# Patient Record
Sex: Male | Born: 1965 | Hispanic: No | Smoking: Never smoker
Health system: Southern US, Community
[De-identification: ages and names within clinical notes are randomized; demographics above are authoritative.]

## PROBLEM LIST (undated history)

## (undated) DIAGNOSIS — M1990 Unspecified osteoarthritis, unspecified site: Secondary | ICD-10-CM

## (undated) DIAGNOSIS — I10 Essential (primary) hypertension: Secondary | ICD-10-CM

## (undated) HISTORY — PX: HERNIA REPAIR: SHX51

---

## 2020-09-17 ENCOUNTER — Other Ambulatory Visit: Payer: Self-pay

## 2020-09-17 ENCOUNTER — Ambulatory Visit: Payer: Self-pay

## 2020-09-17 ENCOUNTER — Encounter: Payer: Self-pay | Admitting: Orthopaedic Surgery

## 2020-09-17 ENCOUNTER — Ambulatory Visit (INDEPENDENT_AMBULATORY_CARE_PROVIDER_SITE_OTHER): Payer: 59 | Admitting: Orthopaedic Surgery

## 2020-09-17 VITALS — Ht 70.0 in | Wt 182.0 lb

## 2020-09-17 DIAGNOSIS — M1611 Unilateral primary osteoarthritis, right hip: Secondary | ICD-10-CM | POA: Diagnosis not present

## 2020-09-17 DIAGNOSIS — M545 Low back pain, unspecified: Secondary | ICD-10-CM | POA: Diagnosis not present

## 2020-09-17 DIAGNOSIS — G8929 Other chronic pain: Secondary | ICD-10-CM

## 2020-09-17 DIAGNOSIS — M1612 Unilateral primary osteoarthritis, left hip: Secondary | ICD-10-CM | POA: Diagnosis not present

## 2020-09-17 NOTE — Progress Notes (Signed)
Office Visit Note   Patient: Jesus Hammond           Date of Birth: 1965-06-24           MRN: [id number] Visit Date: 09/17/2020              Requested by: No referring provider defined for this encounter. PCP: No primary care provider on file.   Assessment & Plan: Visit Diagnoses:  1. Primary osteoarthritis of left hip   2. Primary osteoarthritis of right hip   3. Chronic midline low back pain without sciatica     Plan: Based on findings I feel that his back is aggravated by the hip disease.  His hips are severely degenerative and worse on the left.  Given how severe his pain and symptoms and radiographic findings I do not feel that cortisone injections or continued conservative treatment would be of any long-term benefit.  He has chronic nighttime pain preventing him from sleeping more than 2 to 3 hours a night.  He is unable to work effectively due to the pain.  Treatment options were discussed and I have recommended sequential total hip replacements versus bilateral total hip replacement and the associated risks and benefits and rehab and recovery time.  We talked extensively about the details of the surgery including risks of infection, DVT, leg length discrepancy, dislocation, thigh numbness.  I have provided him with the necessary information so that he can make an informed decision in the near future.  He will give [place] a call back when he is ready to schedule surgery.  Follow-Up Instructions: Return for Will call back to schedule surgery..   Orders:  Orders Placed This Encounter  Procedures  . XR Lumbar Spine 2-3 Views  . XR Pelvis 1-2 Views   No orders of the defined types were placed in this encounter.     Procedures: No procedures performed   Clinical Data: No additional findings.   Subjective: Chief Complaint  Patient presents with  . 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  He has severe limitation and hip range of motion and severe difficulty with daily activities such as walking and putting on socks and shoes and getting in and out of the bed and his car.  He is fearful of falling.  Recently started having low back pain in the midline without any radicular symptoms.  Denies any injuries.  He has taken ibuprofen for several years with diminishing relief recently.  He has Constant hip and groin pain worse on the left.   Review of Systems  Constitutional: Negative.   All other systems reviewed and are negative.    Objective: Vital Signs: Ht 5\' 10"  (1.778 m)   Wt 182 lb (82.6 kg)   BMI 26.11 kg/m   Physical Exam Vitals and nursing note reviewed.  Constitutional:      Appearance: He is well-developed.  HENT:     Head: Normocephalic and atraumatic.  Eyes:     Pupils: Pupils are equal, round, and reactive to light.  Pulmonary:     Effort: Pulmonary effort is normal.  Abdominal:     Palpations: Abdomen is soft.  Musculoskeletal:        General: Normal range of motion.     Cervical  back: Neck supple.  Skin:    General: Skin is warm.  Neurological:     Mental Status: He is alert and oriented to person, place, and time.  Psychiatric:        Behavior: Behavior normal.        Thought Content: Thought content normal.        Judgment: Judgment normal.     Ortho Exam Left hip shows severe pain with logroll and essentially no internal rotation and 5 degrees of external rotation with severe pain.  Unable to perform straight leg raise secondary to pain in the hip and groin.  Right hip shows severe pain with logroll and no internal rotation and about 10 degrees of external rotation with severe pain.  Unable to perform straight leg raise secondary to pain in the hip and groin.    Lumbar spine shows slight tenderness to  the spinous processes.  No focal findings distally.  Her range of motion is slightly limited secondary to pain. Specialty Comments:  No specialty comments available.  Imaging: XR Lumbar Spine 2-3 Views  Result Date: 09/17/2020 Lumbar spondylosis and facet disease.  No acute abnormalities.  XR Pelvis 1-2 Views  Result Date: 09/17/2020 Advanced degenerative changes to bilateral hips slightly worse on the left side.    PMFS History: There are no problems to display for this patient.  History reviewed. No pertinent past medical history.  History reviewed. No pertinent family history.  History reviewed. No pertinent surgical history. Social History   Occupational History  . Not on file  Tobacco Use  . Smoking status: Not on file  . Smokeless tobacco: Not on file  Substance and Sexual Activity  . Alcohol use: Not on file  . Drug use: Not on file  . Sexual activity: Not on file

## 2020-09-29 ENCOUNTER — Other Ambulatory Visit: Payer: Self-pay

## 2020-11-09 ENCOUNTER — Telehealth: Payer: Self-pay | Admitting: Orthopaedic Surgery

## 2020-11-09 NOTE — Telephone Encounter (Signed)
Received $25.00 cash,medical records release form and FMLA/Disability forms from patient   Forwarding to [facility] today

## 2020-11-11 NOTE — Pre-Procedure Instructions (Signed)
Surgical Instructions    Your procedure is scheduled on Monday July 18th .  Report to [facility] Main Entrance "A" at 05:30 A.M., then check in with the Admitting office.  Call this number if you have problems the morning of surgery:  [phone]   If you have any questions prior to your surgery date call [phone]: Open Monday-Friday 8am-4pm    Remember:  Do not eat after midnight the night before your surgery  You may drink clear liquids until 04:15 A.M. the morning of your surgery.   Clear liquids allowed are: Water, Non-Citrus Juices (without pulp), Carbonated Beverages, Clear Tea, Black Coffee Only, and Gatorade Patient Instructions  The night before surgery:  No food after midnight. ONLY clear liquids after midnight  The day of surgery (if you do NOT have diabetes):  Drink ONE (1) Pre-Surgery Clear Ensure by 04:15 A.M. the morning of surgery. Drink in one sitting. Do not sip.  This drink was given to you during your hospital  pre-op appointment visit.  Nothing else to drink after completing the  Pre-Surgery Clear Ensure.         If you have questions, please contact your surgeon's office.     Take these medicines the morning of surgery with A SIP OF WATER   NONE     As of today, STOP taking any Aspirin (unless otherwise instructed by your surgeon) Aleve, Naproxen, Ibuprofen, Motrin, Advil, Goody's, BC's, all herbal medications, fish oil, all vitamins and pseudoephedrine-acetaminophen.          Do not wear jewelry or makeup Do not wear lotions, powders, perfumes/colognes, or deodorant. Do not shave 48 hours prior to surgery.  Men may shave face and neck. Do not bring valuables to the hospital. DO Not wear nail polish, gel polish, artificial nails, or any other type of covering on natural nails including finger and toenails. If patients have artificial nails, gel coating, etc. that need to be removed by a nail salon please have this removed prior to surgery or  surgery may need to be canceled/delayed if the surgeon/ anesthesia feels like the patient is unable to be adequately monitored.             [facility] is not responsible for any belongings or valuables.  Do NOT Smoke (Tobacco/Vaping) or drink Alcohol 24 hours prior to your procedure If you use a CPAP at night, you may bring all equipment for your overnight stay.   Contacts, glasses, dentures or bridgework may not be worn into surgery, please bring cases for these belongings   For patients admitted to the hospital, discharge time will be determined by your treatment team.   Patients discharged the day of surgery will not be allowed to drive home, and someone needs to stay with them for 24 hours.  ONLY 1 SUPPORT PERSON MAY BE PRESENT WHILE YOU ARE IN SURGERY. IF YOU ARE TO BE ADMITTED ONCE YOU ARE IN YOUR ROOM YOU WILL BE ALLOWED TWO (2) VISITORS.  Minor children may have two parents present. Special consideration for safety and communication needs will be reviewed on a case by case basis.  Special instructions:    Oral Hygiene is also important to reduce your risk of infection.  Remember - BRUSH YOUR TEETH THE MORNING OF SURGERY WITH YOUR REGULAR TOOTHPASTE   [facility]- Preparing For Surgery  Before surgery, you can play an important role. Because skin is not sterile, your skin needs to be as free of germs as  possible. You can reduce the number of germs on your skin by washing with CHG (chlorahexidine gluconate) Soap before surgery.  CHG is an antiseptic cleaner which kills germs and bonds with the skin to continue killing germs even after washing.     Please do not use if you have an allergy to CHG or antibacterial soaps. If your skin becomes reddened/irritated stop using the CHG.  Do not shave (including legs and underarms) for at least 48 hours prior to first CHG shower. It is OK to shave your face.  Please follow these instructions carefully.     Shower the NIGHT BEFORE SURGERY  and the MORNING OF SURGERY with CHG Soap.   If you chose to wash your hair, wash your hair first as usual with your normal shampoo. After you shampoo, rinse your hair and body thoroughly to remove the shampoo.  Then [employer] and genitals (private parts) with your normal soap and rinse thoroughly to remove soap.  After that Use CHG Soap as you would any other liquid soap. You can apply CHG directly to the skin and wash gently with a scrungie or a clean washcloth.   Apply the CHG Soap to your body ONLY FROM THE NECK DOWN.  Do not use on open wounds or open sores. Avoid contact with your eyes, ears, mouth and genitals (private parts). Wash Face and genitals (private parts)  with your normal soap.   Wash thoroughly, paying special attention to the area where your surgery will be performed.  Thoroughly rinse your body with warm water from the neck down.  DO NOT shower/wash with your normal soap after using and rinsing off the CHG Soap.  Pat yourself dry with a CLEAN TOWEL.  Wear CLEAN PAJAMAS to bed the night before surgery  Place CLEAN SHEETS on your bed the night before your surgery  DO NOT SLEEP WITH PETS.   Day of Surgery:  Take a shower with CHG soap. Wear Clean/Comfortable clothing the morning of surgery Do not apply any deodorants/lotions.   Remember to brush your teeth WITH YOUR REGULAR TOOTHPASTE.   Please read over the following fact sheets that you were given.

## 2020-11-12 ENCOUNTER — Encounter (HOSPITAL_COMMUNITY): Payer: Self-pay

## 2020-11-12 ENCOUNTER — Encounter (HOSPITAL_COMMUNITY)
Admission: RE | Admit: 2020-11-12 | Discharge: 2020-11-12 | Disposition: A | Discharge status: Home / Self Care | Payer: 59 | Source: Ambulatory Visit | Attending: Orthopaedic Surgery | Admitting: Orthopaedic Surgery

## 2020-11-12 ENCOUNTER — Telehealth: Payer: Self-pay | Admitting: Orthopaedic Surgery

## 2020-11-12 ENCOUNTER — Other Ambulatory Visit: Payer: Self-pay

## 2020-11-12 ENCOUNTER — Ambulatory Visit (HOSPITAL_COMMUNITY)
Admission: RE | Admit: 2020-11-12 | Discharge: 2020-11-12 | Disposition: A | Discharge status: Home / Self Care | Payer: 59 | Source: Ambulatory Visit | Attending: Physician Assistant | Admitting: Physician Assistant

## 2020-11-12 ENCOUNTER — Other Ambulatory Visit (HOSPITAL_COMMUNITY): Payer: 59

## 2020-11-12 DIAGNOSIS — Z01818 Encounter for other preprocedural examination: Secondary | ICD-10-CM | POA: Diagnosis not present

## 2020-11-12 DIAGNOSIS — M1611 Unilateral primary osteoarthritis, right hip: Secondary | ICD-10-CM

## 2020-11-12 DIAGNOSIS — Z20822 Contact with and (suspected) exposure to covid-19: Secondary | ICD-10-CM | POA: Diagnosis not present

## 2020-11-12 DIAGNOSIS — I4510 Unspecified right bundle-branch block: Secondary | ICD-10-CM | POA: Diagnosis not present

## 2020-11-12 HISTORY — DX: Essential (primary) hypertension: I10

## 2020-11-12 HISTORY — DX: Unspecified osteoarthritis, unspecified site: M19.90

## 2020-11-12 LAB — COMPREHENSIVE METABOLIC PANEL
ALT: 18 U/L (ref 0–44)
AST: 20 U/L (ref 15–41)
Albumin: 4 g/dL (ref 3.5–5.0)
Alkaline Phosphatase: 56 U/L (ref 38–126)
Anion gap: 11 (ref 5–15)
BUN: 8 mg/dL (ref 6–20)
CO2: 23 mmol/L (ref 22–32)
Calcium: 9.4 mg/dL (ref 8.9–10.3)
Chloride: 108 mmol/L (ref 98–111)
Creatinine, Ser: 0.76 mg/dL (ref 0.61–1.24)
GFR, Estimated: >60 mL/min (ref >60)
Glucose, Bld: 103 mg/dL — ABNORMAL HIGH (ref 70–99)
Potassium: 4.7 mmol/L (ref 3.5–5.1)
Sodium: 142 mmol/L (ref 135–145)
Total Bilirubin: 1.1 mg/dL (ref 0.3–1.2)
Total Protein: 7 g/dL (ref 6.5–8.1)

## 2020-11-12 LAB — CBC WITH DIFFERENTIAL/PLATELET
Abs Immature Granulocytes: 0.01 10*3/uL (ref 0.00–0.07)
Basophils Absolute: 0.1 10*3/uL (ref 0.0–0.1)
Basophils Relative: 2 %
Eosinophils Absolute: 0.1 10*3/uL (ref 0.0–0.5)
Eosinophils Relative: 2 %
HCT: 44.9 % (ref 39.0–52.0)
Hemoglobin: 14.1 g/dL (ref 13.0–17.0)
Immature Granulocytes: 0 %
Lymphocytes Relative: 27 %
Lymphs Abs: 1.8 10*3/uL (ref 0.7–4.0)
MCH: 26.7 pg (ref 26.0–34.0)
MCHC: 31.4 g/dL (ref 30.0–36.0)
MCV: 84.9 fL (ref 80.0–100.0)
Monocytes Absolute: 0.5 10*3/uL (ref 0.1–1.0)
Monocytes Relative: 8 %
Neutro Abs: 4.1 10*3/uL (ref 1.7–7.7)
Neutrophils Relative %: 61 %
Platelets: 265 10*3/uL (ref 150–400)
RBC: 5.29 MIL/uL (ref 4.22–5.81)
RDW: 13.4 % (ref 11.5–15.5)
WBC: 6.6 10*3/uL (ref 4.0–10.5)
nRBC: 0 % (ref 0.0–0.2)

## 2020-11-12 LAB — URINALYSIS, ROUTINE W REFLEX MICROSCOPIC
Bilirubin Urine: NEGATIVE
Glucose, UA: NEGATIVE mg/dL
Hgb urine dipstick: NEGATIVE
Ketones, ur: NEGATIVE mg/dL
Leukocytes,Ua: NEGATIVE
Nitrite: NEGATIVE
Protein, ur: NEGATIVE mg/dL
Specific Gravity, Urine: 1.01 (ref 1.005–1.030)
pH: 7 (ref 5.0–8.0)

## 2020-11-12 LAB — PROTIME-INR
INR: 1 (ref 0.8–1.2)
Prothrombin Time: 13.1 seconds (ref 11.4–15.2)

## 2020-11-12 LAB — SURGICAL PCR SCREEN
MRSA, PCR: NEGATIVE
Staphylococcus aureus: NEGATIVE

## 2020-11-12 LAB — SARS CORONAVIRUS 2 (TAT 6-24 HRS): SARS Coronavirus 2: NEGATIVE

## 2020-11-12 LAB — TYPE AND SCREEN
ABO/RH(D): A POS
Antibody Screen: NEGATIVE

## 2020-11-12 LAB — APTT: aPTT: 29 seconds (ref 24–36)

## 2020-11-12 NOTE — Progress Notes (Signed)
Jesus Hammond., PA-C notified on elevated BP during PAT appt. Pt instructed to make appt with PCP asap and possibly get prescribed a BP medication if deemed necessary. Pt given phone number for PCP and stated he would call and make appt asap.

## 2020-11-12 NOTE — Progress Notes (Signed)
Anesthesia Chart Review:  Case: [id number] Date/Time: 11/16/20 0700   Procedure: BILATERAL ANTERIOR TOTAL HIP ARTHROPLASTY (Bilateral: Hip) - 3-C   Anesthesia type: Spinal   Pre-op diagnosis: BILATERAL HIP DEGENERATIVE JOING DISEASE   Location: [facility] ROOM 04 / [facility] OR   Surgeons: Tarry Kos, MD       DISCUSSION: Patient is a 55 year old male scheduled for the above procedure.   History includes never smoker, HTN, arthritis.   Received a call that PAT BP was elevated at 178/113 and 173/113. He reported that he became established with PCP Judd Lien, PA-C in July of last year. He was started on lisinopril 10 mg daily at that time for BP of 143/96. He monitored his BP at work and said numbers improved, so eventually he just stopped taking it. He says that he periodically monitors his BP at work and noted it has been elevated but could not recall the numbers.  He does not otherwise report any significant health issues.  Primary issue for him has been bilateral hip pain. He began having left hip pain about 1 1/12 years ago but over the past year has developed hip pain on the right as well.  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 He does not get chest pain or SOB. No known DM or heart disease. Non-smoker.    Given untreated, poorly controlled HTN and surgery scheduled for next week, he was advised that he should follow-up with primary care asap. Discussed a BP at the current level would likely lead to case delay or cancellation. Unfortunately, there is no BP from his orthopedic visit and no recorded BP since his 11/21/19 visit at [facility], so unclear what his BP trends are. Advised that he either purchase a home BP monitor or perform more routine BP monitoring at work and record results at least until PCP is able to get HTN better under control.   Since his PAT  visit, I received a call from Debbie at Dr. Warren Danes office. Patient reported that he was seen by Salomon Mast, PA-C on 11/12/20 for HTN. (Note is not yet viewable in Care Everywhere.) His BP there was 163/116. He says he was started on lisinopril 40 mg daily and HCTZ 25 mg daily. He plans to take medications on a morning schedule. Discussed HTN and new medications with anesthesiologist Adonis Huguenin, MD since generally both classes of these medications are held on the morning of surgery. Given significantly elevated DBP without medications, it was felt better for Mr. Chesnut to take his lisinopril on the morning of surgery with sips of water. He will hold his HCTZ on the morning of surgery, but anticipate it will be resumed postoperatively per Dr. Warren Danes post-operative orders. (I have added his medications to his medication list.) Eunice Blase has reviewed these instructions with Mr. Dupre.   He will get vitals on arrival and be evaluated by his anesthesia team on the day of surgery.  11/12/20 COVID-19 test negative. 11/12/20 CXR is still in process.     VS: BP (!) 173/113   Pulse 75   Temp 37.1 C (Oral)   Resp 18   Ht 5\' 10"  (1.778 m)   Wt 84.6 kg   SpO2 98%   BMI 26.75 kg/m   PROVIDERS: , PA-C is PBP [facility] Care Everywhere)   LABS: Labs  reviewed: Acceptable for surgery. (all labs ordered are listed, but only abnormal results are displayed)  Labs Reviewed  COMPREHENSIVE METABOLIC PANEL - Abnormal; Notable for the following components:      Result Value   Glucose, Bld 103 (*)    All other components within normal limits  SURGICAL PCR SCREEN  SARS CORONAVIRUS 2 (TAT 6-24 HRS)  CBC WITH DIFFERENTIAL/PLATELET  PROTIME-INR  APTT  URINALYSIS, ROUTINE W REFLEX MICROSCOPIC  TYPE AND SCREEN     IMAGES: CXR 11/12/20: In process.   EKG: 11/12/20: Normal sinus rhythm Incomplete right bundle branch block Poor R wave progression Possible Anterior infarct , age undetermined Abnormal  ECG No old tracing to compare Confirmed by Donato Schultz ([id number]) on 11/12/2020 3:13:41 PM   CV: N/A  Past Medical History:  Diagnosis Date   Arthritis    bilateral hips   Hypertension     Past Surgical History:  Procedure Laterality Date   HERNIA REPAIR Left    inguinal hernia    MEDICATIONS:  hydrochlorothiazide (HYDRODIURIL) 25 MG tablet   lisinopril (ZESTRIL) 40 MG tablet   ibuprofen (ADVIL) 200 MG tablet   pseudoephedrine-acetaminophen (TYLENOL SINUS) 30-500 MG TABS tablet   No current facility-administered medications for this encounter.    Shonna Chock, PA-C Surgical Short Stay/Anesthesiology [facility] Phone [phone] [facility] Phone [phone] 11/13/2020 8:09 AM

## 2020-11-12 NOTE — Telephone Encounter (Signed)
FYI

## 2020-11-12 NOTE — Telephone Encounter (Signed)
FYI:    Patient is scheduled for bilateral hip arthroplasty  Monday July 18th with Dr. Roda Shutters at 7:15am.    Patient went for pre-admission visit this morning at 8am.  BP taken twice with readings of 173/113 and 178/113.  Patient was advised to see PCP as soon as possible.  I have called patient and he is on his way to see a provider NOW- with [facility].  He has seen Irving Copas, PA in the past and was put on BP meds for a short time until controlled.  Patient will call back after being seen to provide [place] with update.  Faxing request for clearance to [facility] provider.

## 2020-11-12 NOTE — Progress Notes (Signed)
PCP - denies Cardiologist - denies  PPM/ICD - denies   Chest x-ray - 11/12/20 EKG - 11/12/20 Stress Test - denies ECHO - denies Cardiac Cath - denies  Sleep Study - denies  No diabetes  Patient instructed to hold all Aspirin, NSAID's, herbal medications, fish oil and vitamins 7 days prior to surgery.   ERAS Protcol -yes PRE-SURGERY Ensure or G2- ensure given  COVID TEST- 11/12/20 in PAT   Anesthesia review: yes. Abnormal EKG and elevated BP at PAT appt  Patient denies shortness of breath, fever, cough and chest pain at PAT appointment   All instructions explained to the patient, with a verbal understanding of the material. Patient agrees to go over the instructions while at home for a better understanding. Patient also instructed to self quarantine after being tested for COVID-19. The opportunity to ask questions was provided.

## 2020-11-13 ENCOUNTER — Other Ambulatory Visit: Payer: Self-pay | Admitting: Physician Assistant

## 2020-11-13 MED ORDER — METHOCARBAMOL 500 MG PO TABS: 500.0000 mg | ORAL_TABLET | Freq: Two times a day (BID) | ORAL | 0 refills | Status: DC | PRN

## 2020-11-13 MED ORDER — ONDANSETRON HCL 4 MG PO TABS: 4.0000 mg | ORAL_TABLET | Freq: Three times a day (TID) | ORAL | 0 refills | Status: DC | PRN

## 2020-11-13 MED ORDER — DOCUSATE SODIUM 100 MG PO CAPS: 100.0000 mg | ORAL_CAPSULE | Freq: Every day | ORAL | 2 refills | Status: AC | PRN

## 2020-11-13 MED ORDER — OXYCODONE-ACETAMINOPHEN 5-325 MG PO TABS: 1.0000 | ORAL_TABLET | Freq: Four times a day (QID) | ORAL | 0 refills | Status: DC | PRN

## 2020-11-13 MED ORDER — ASPIRIN EC 81 MG PO TBEC: 81.0000 mg | DELAYED_RELEASE_TABLET | Freq: Two times a day (BID) | ORAL | 0 refills | Status: DC

## 2020-11-13 NOTE — Anesthesia Preprocedure Evaluation (Addendum)
Anesthesia Evaluation  Patient identified by MRN, date of birth, ID band Patient awake    Reviewed: Allergy & Precautions, NPO status , Patient's Chart, lab work & pertinent test results  Airway Mallampati: II  TM Distance: >3 FB Neck ROM: Full    Dental  (+) Teeth Intact, Dental Advisory Given   Pulmonary neg pulmonary ROS,    breath sounds clear to auscultation       Cardiovascular hypertension, Pt. on medications  Rhythm:Regular Rate:Normal  EKG: 11/12/20: Normal sinus rhythm Incomplete right bundle branch block Poor R wave progression Possible Anterior infarct , age undetermined Abnormal ECG No old tracing to compare   Neuro/Psych negative neurological ROS  negative psych ROS   GI/Hepatic negative GI ROS, Neg liver ROS,   Endo/Other  negative endocrine ROS  Renal/GU negative Renal ROS  negative genitourinary   Musculoskeletal  (+) Arthritis ,   Abdominal   Peds  Hematology negative hematology ROS (+)   Anesthesia Other Findings   Reproductive/Obstetrics                          Anesthesia Physical Anesthesia Plan  ASA: 2  Anesthesia Plan: Spinal   Post-op Pain Management:    Induction:   PONV Risk Score and Plan: 1 and Treatment may vary due to age or medical condition, Propofol infusion, Midazolam, Ondansetron and Dexamethasone  Airway Management Planned: Natural Airway  Additional Equipment:   Intra-op Plan:   Post-operative Plan:   Informed Consent: I have reviewed the patients History and Physical, chart, labs and discussed the procedure including the risks, benefits and alternatives for the proposed anesthesia with the patient or authorized representative who has indicated his/her understanding and acceptance.     Dental advisory given  Plan Discussed with: CRNA  Anesthesia Plan Comments: ( )       Anesthesia Quick Evaluation

## 2020-11-16 ENCOUNTER — Observation Stay (HOSPITAL_COMMUNITY)
Admission: RE | Admit: 2020-11-16 | Discharge: 2020-11-18 | Disposition: A | Discharge status: Home / Self Care | Payer: 59 | Attending: Orthopaedic Surgery | Admitting: Orthopaedic Surgery

## 2020-11-16 ENCOUNTER — Observation Stay (HOSPITAL_COMMUNITY): Payer: 59

## 2020-11-16 ENCOUNTER — Ambulatory Visit (HOSPITAL_COMMUNITY): Payer: 59 | Admitting: Vascular Surgery

## 2020-11-16 ENCOUNTER — Other Ambulatory Visit: Payer: Self-pay

## 2020-11-16 ENCOUNTER — Ambulatory Visit (HOSPITAL_COMMUNITY): Payer: 59

## 2020-11-16 ENCOUNTER — Encounter (HOSPITAL_COMMUNITY)
Admission: RE | Disposition: A | Discharge status: Home / Self Care | Payer: Self-pay | Source: Home / Self Care | Attending: Orthopaedic Surgery

## 2020-11-16 ENCOUNTER — Ambulatory Visit (HOSPITAL_COMMUNITY): Payer: 59 | Admitting: Anesthesiology

## 2020-11-16 ENCOUNTER — Other Ambulatory Visit (HOSPITAL_COMMUNITY): Payer: Self-pay | Admitting: Physician Assistant

## 2020-11-16 ENCOUNTER — Encounter (HOSPITAL_COMMUNITY): Payer: Self-pay | Admitting: Orthopaedic Surgery

## 2020-11-16 DIAGNOSIS — Z419 Encounter for procedure for purposes other than remedying health state, unspecified: Secondary | ICD-10-CM

## 2020-11-16 DIAGNOSIS — Z79899 Other long term (current) drug therapy: Secondary | ICD-10-CM | POA: Diagnosis not present

## 2020-11-16 DIAGNOSIS — M1611 Unilateral primary osteoarthritis, right hip: Secondary | ICD-10-CM

## 2020-11-16 DIAGNOSIS — M160 Bilateral primary osteoarthritis of hip: Principal | ICD-10-CM | POA: Insufficient documentation

## 2020-11-16 DIAGNOSIS — M1612 Unilateral primary osteoarthritis, left hip: Secondary | ICD-10-CM

## 2020-11-16 DIAGNOSIS — Z7982 Long term (current) use of aspirin: Secondary | ICD-10-CM | POA: Diagnosis not present

## 2020-11-16 DIAGNOSIS — I10 Essential (primary) hypertension: Secondary | ICD-10-CM | POA: Insufficient documentation

## 2020-11-16 DIAGNOSIS — Z96649 Presence of unspecified artificial hip joint: Secondary | ICD-10-CM

## 2020-11-16 DIAGNOSIS — Z96643 Presence of artificial hip joint, bilateral: Secondary | ICD-10-CM

## 2020-11-16 HISTORY — PX: TOTAL HIP ARTHROPLASTY: SHX124

## 2020-11-16 HISTORY — PX: BILATERAL ANTERIOR TOTAL HIP ARTHROPLASTY: SHX5567

## 2020-11-16 LAB — ABO/RH: ABO/RH(D): A POS

## 2020-11-16 SURGERY — ARTHROPLASTY, HIP, BILATERAL, TOTAL, ANTERIOR APPROACH
Anesthesia: Spinal | Site: Hip | Laterality: Right

## 2020-11-16 MED ORDER — CHLORHEXIDINE GLUCONATE CLOTH 2 % EX PADS
6.0000 | MEDICATED_PAD | Freq: Every day | CUTANEOUS | Status: DC
  Administered 2020-11-17 – 2020-11-18 (×3): 6 via TOPICAL

## 2020-11-16 MED ORDER — DOCUSATE SODIUM 100 MG PO CAPS
100.0000 mg | ORAL_CAPSULE | Freq: Two times a day (BID) | ORAL | Status: DC
  Administered 2020-11-16 – 2020-11-18 (×4): 100 mg via ORAL
  Filled 2020-11-16 (×4): qty 1

## 2020-11-16 MED ORDER — OXYCODONE HCL ER 10 MG PO T12A
10.0000 mg | EXTENDED_RELEASE_TABLET | Freq: Two times a day (BID) | ORAL | Status: DC
Start: 2020-11-16 — End: 2020-11-18
  Administered 2020-11-16 – 2020-11-18 (×4): 10 mg via ORAL
  Filled 2020-11-16 (×4): qty 1

## 2020-11-16 MED ORDER — OXYCODONE HCL 5 MG PO TABS: 5.0000 mg | ORAL_TABLET | ORAL | Status: DC | PRN

## 2020-11-16 MED ORDER — CEFAZOLIN SODIUM-DEXTROSE 2-4 GM/100ML-% IV SOLN
2.0000 g | INTRAVENOUS | Status: AC
  Administered 2020-11-16: 2 g via INTRAVENOUS
  Filled 2020-11-16: qty 100

## 2020-11-16 MED ORDER — IRRISEPT - 450ML BOTTLE WITH 0.05% CHG IN STERILE WATER, USP 99.95% OPTIME
TOPICAL | Status: DC | PRN
  Administered 2020-11-16 (×2): 450 mL

## 2020-11-16 MED ORDER — SORBITOL 70 % SOLN: 30.0000 mL | Freq: Every day | Status: DC | PRN

## 2020-11-16 MED ORDER — LIDOCAINE 2% (20 MG/ML) 5 ML SYRINGE
INTRAMUSCULAR | Status: AC
  Filled 2020-11-16: qty 5

## 2020-11-16 MED ORDER — MIDAZOLAM HCL 2 MG/2ML IJ SOLN
INTRAMUSCULAR | Status: AC
  Filled 2020-11-16: qty 2

## 2020-11-16 MED ORDER — FENTANYL CITRATE (PF) 250 MCG/5ML IJ SOLN
INTRAMUSCULAR | Status: AC
  Filled 2020-11-16: qty 5

## 2020-11-16 MED ORDER — METOCLOPRAMIDE HCL 5 MG PO TABS: 5.0000 mg | ORAL_TABLET | Freq: Three times a day (TID) | ORAL | Status: DC | PRN

## 2020-11-16 MED ORDER — METHOCARBAMOL 1000 MG/10ML IJ SOLN
500.0000 mg | Freq: Four times a day (QID) | INTRAVENOUS | Status: DC | PRN
  Filled 2020-11-16: qty 5

## 2020-11-16 MED ORDER — BUPIVACAINE-MELOXICAM ER 200-6 MG/7ML IJ SOLN
INTRAMUSCULAR | Status: AC
  Filled 2020-11-16: qty 1

## 2020-11-16 MED ORDER — PHENOL 1.4 % MT LIQD: 1.0000 | OROMUCOSAL | Status: DC | PRN

## 2020-11-16 MED ORDER — TRANEXAMIC ACID-NACL 1000-0.7 MG/100ML-% IV SOLN
INTRAVENOUS | Status: AC
  Filled 2020-11-16: qty 100

## 2020-11-16 MED ORDER — KETOROLAC TROMETHAMINE 15 MG/ML IJ SOLN
30.0000 mg | Freq: Four times a day (QID) | INTRAMUSCULAR | Status: AC | PRN
  Administered 2020-11-17: 30 mg via INTRAVENOUS
  Filled 2020-11-16: qty 2

## 2020-11-16 MED ORDER — LACTATED RINGERS IV SOLN: INTRAVENOUS | Status: DC

## 2020-11-16 MED ORDER — POLYETHYLENE GLYCOL 3350 17 G PO PACK
17.0000 g | PACK | Freq: Every day | ORAL | Status: DC
  Administered 2020-11-17 – 2020-11-18 (×2): 17 g via ORAL
  Filled 2020-11-16 (×2): qty 1

## 2020-11-16 MED ORDER — ALBUMIN HUMAN 5 % IV SOLN
INTRAVENOUS | Status: AC
  Administered 2020-11-16: 12.5 g via INTRAVENOUS
  Filled 2020-11-16: qty 250

## 2020-11-16 MED ORDER — BUPIVACAINE HCL (PF) 0.75 % IJ SOLN: INTRAMUSCULAR | Status: DC | PRN

## 2020-11-16 MED ORDER — DEXAMETHASONE SODIUM PHOSPHATE 10 MG/ML IJ SOLN
INTRAMUSCULAR | Status: DC | PRN
  Administered 2020-11-16: 10 mg via INTRAVENOUS

## 2020-11-16 MED ORDER — VANCOMYCIN HCL 1 G IV SOLR
INTRAVENOUS | Status: DC | PRN
  Administered 2020-11-16 (×2): 1000 mg via TOPICAL

## 2020-11-16 MED ORDER — TRANEXAMIC ACID-NACL 1000-0.7 MG/100ML-% IV SOLN
1000.0000 mg | Freq: Once | INTRAVENOUS | Status: AC
  Administered 2020-11-16: 1000 mg via INTRAVENOUS
  Filled 2020-11-16: qty 100

## 2020-11-16 MED ORDER — ACETAMINOPHEN 500 MG PO TABS
1000.0000 mg | ORAL_TABLET | Freq: Four times a day (QID) | ORAL | Status: AC
  Administered 2020-11-16 – 2020-11-17 (×4): 1000 mg via ORAL
  Filled 2020-11-16 (×4): qty 2

## 2020-11-16 MED ORDER — BUPIVACAINE-MELOXICAM ER 400-12 MG/14ML IJ SOLN
INTRAMUSCULAR | Status: AC
  Filled 2020-11-16: qty 1

## 2020-11-16 MED ORDER — PROPOFOL 10 MG/ML IV BOLUS
INTRAVENOUS | Status: AC
  Filled 2020-11-16: qty 20

## 2020-11-16 MED ORDER — SODIUM CHLORIDE 0.9 % IR SOLN
Status: DC | PRN
  Administered 2020-11-16: 2000 mL

## 2020-11-16 MED ORDER — FENTANYL CITRATE (PF) 100 MCG/2ML IJ SOLN: 25.0000 ug | INTRAMUSCULAR | Status: DC | PRN

## 2020-11-16 MED ORDER — BUPIVACAINE-MELOXICAM ER 400-12 MG/14ML IJ SOLN
INTRAMUSCULAR | Status: DC | PRN
  Administered 2020-11-16 (×2): 7 mL

## 2020-11-16 MED ORDER — DEXAMETHASONE SODIUM PHOSPHATE 10 MG/ML IJ SOLN
INTRAMUSCULAR | Status: AC
  Filled 2020-11-16: qty 1

## 2020-11-16 MED ORDER — RIVAROXABAN 10 MG PO TABS: 10.0000 mg | ORAL_TABLET | Freq: Every day | ORAL | 0 refills | Status: DC

## 2020-11-16 MED ORDER — CHLORHEXIDINE GLUCONATE 0.12 % MT SOLN
15.0000 mL | Freq: Once | OROMUCOSAL | Status: AC
  Administered 2020-11-16: 15 mL via OROMUCOSAL
  Filled 2020-11-16: qty 15

## 2020-11-16 MED ORDER — ONDANSETRON HCL 4 MG/2ML IJ SOLN
INTRAMUSCULAR | Status: AC
  Filled 2020-11-16: qty 2

## 2020-11-16 MED ORDER — MENTHOL 3 MG MT LOZG: 1.0000 | LOZENGE | OROMUCOSAL | Status: DC | PRN

## 2020-11-16 MED ORDER — ORAL CARE MOUTH RINSE: 15.0000 mL | Freq: Once | OROMUCOSAL | Status: AC

## 2020-11-16 MED ORDER — 0.9 % SODIUM CHLORIDE (POUR BTL) OPTIME
TOPICAL | Status: DC | PRN
  Administered 2020-11-16: 2000 mL

## 2020-11-16 MED ORDER — ACETAMINOPHEN 325 MG PO TABS: 325.0000 mg | ORAL_TABLET | Freq: Four times a day (QID) | ORAL | Status: DC | PRN

## 2020-11-16 MED ORDER — ACETAMINOPHEN 500 MG PO TABS: 1000.0000 mg | ORAL_TABLET | Freq: Once | ORAL | Status: DC

## 2020-11-16 MED ORDER — BUPIVACAINE IN DEXTROSE 0.75-8.25 % IT SOLN
INTRATHECAL | Status: DC | PRN
  Administered 2020-11-16: 2 mL via INTRATHECAL

## 2020-11-16 MED ORDER — DIPHENHYDRAMINE HCL 12.5 MG/5ML PO ELIX: 25.0000 mg | ORAL_SOLUTION | ORAL | Status: DC | PRN

## 2020-11-16 MED ORDER — FENTANYL CITRATE (PF) 250 MCG/5ML IJ SOLN
INTRAMUSCULAR | Status: DC | PRN
  Administered 2020-11-16: 25 ug via INTRAVENOUS
  Administered 2020-11-16 (×2): 50 ug via INTRAVENOUS

## 2020-11-16 MED ORDER — POVIDONE-IODINE 10 % EX SWAB
2.0000 "application " | Freq: Once | CUTANEOUS | Status: AC
  Administered 2020-11-16: 2 via TOPICAL

## 2020-11-16 MED ORDER — PHENYLEPHRINE 40 MCG/ML (10ML) SYRINGE FOR IV PUSH (FOR BLOOD PRESSURE SUPPORT)
PREFILLED_SYRINGE | INTRAVENOUS | Status: AC
  Filled 2020-11-16: qty 10

## 2020-11-16 MED ORDER — SODIUM CHLORIDE 0.9 % IV SOLN: INTRAVENOUS | Status: DC

## 2020-11-16 MED ORDER — VANCOMYCIN HCL 1000 MG IV SOLR
INTRAVENOUS | Status: AC
  Filled 2020-11-16: qty 2000

## 2020-11-16 MED ORDER — ONDANSETRON HCL 4 MG/2ML IJ SOLN
INTRAMUSCULAR | Status: DC | PRN
  Administered 2020-11-16: 4 mg via INTRAVENOUS

## 2020-11-16 MED ORDER — SULFAMETHOXAZOLE-TRIMETHOPRIM 800-160 MG PO TABS: 1.0000 | ORAL_TABLET | Freq: Two times a day (BID) | ORAL | 0 refills | Status: DC

## 2020-11-16 MED ORDER — VANCOMYCIN HCL 1000 MG IV SOLR
INTRAVENOUS | Status: AC
  Filled 2020-11-16: qty 1000

## 2020-11-16 MED ORDER — ONDANSETRON HCL 4 MG PO TABS: 4.0000 mg | ORAL_TABLET | Freq: Four times a day (QID) | ORAL | Status: DC | PRN

## 2020-11-16 MED ORDER — PROPOFOL 500 MG/50ML IV EMUL
INTRAVENOUS | Status: DC | PRN
  Administered 2020-11-16 (×2): 75 ug/kg/min via INTRAVENOUS

## 2020-11-16 MED ORDER — TRANEXAMIC ACID-NACL 1000-0.7 MG/100ML-% IV SOLN
1000.0000 mg | INTRAVENOUS | Status: AC
  Administered 2020-11-16 (×2): 1000 mg via INTRAVENOUS
  Filled 2020-11-16: qty 100

## 2020-11-16 MED ORDER — OXYCODONE HCL 5 MG PO TABS: 10.0000 mg | ORAL_TABLET | ORAL | Status: DC | PRN

## 2020-11-16 MED ORDER — PHENYLEPHRINE HCL-NACL 10-0.9 MG/250ML-% IV SOLN
INTRAVENOUS | Status: DC | PRN
  Administered 2020-11-16: 25 ug/min via INTRAVENOUS

## 2020-11-16 MED ORDER — PROPOFOL 10 MG/ML IV BOLUS
INTRAVENOUS | Status: DC | PRN
  Administered 2020-11-16 (×2): 30 mg via INTRAVENOUS
  Administered 2020-11-16: 10 mg via INTRAVENOUS
  Administered 2020-11-16: 30 mg via INTRAVENOUS
  Administered 2020-11-16: 20 mg via INTRAVENOUS

## 2020-11-16 MED ORDER — MIDAZOLAM HCL 2 MG/2ML IJ SOLN
INTRAMUSCULAR | Status: DC | PRN
  Administered 2020-11-16: 2 mg via INTRAVENOUS

## 2020-11-16 MED ORDER — DEXAMETHASONE SODIUM PHOSPHATE 10 MG/ML IJ SOLN
10.0000 mg | Freq: Once | INTRAMUSCULAR | Status: AC
  Administered 2020-11-17: 10 mg via INTRAVENOUS
  Filled 2020-11-16: qty 1

## 2020-11-16 MED ORDER — ALUM & MAG HYDROXIDE-SIMETH 200-200-20 MG/5ML PO SUSP: 30.0000 mL | ORAL | Status: DC | PRN

## 2020-11-16 MED ORDER — PANTOPRAZOLE SODIUM 40 MG PO TBEC
40.0000 mg | DELAYED_RELEASE_TABLET | Freq: Every day | ORAL | Status: DC
  Administered 2020-11-16 – 2020-11-18 (×3): 40 mg via ORAL
  Filled 2020-11-16 (×3): qty 1

## 2020-11-16 MED ORDER — CEFAZOLIN SODIUM-DEXTROSE 2-4 GM/100ML-% IV SOLN
2.0000 g | Freq: Four times a day (QID) | INTRAVENOUS | Status: AC
  Administered 2020-11-16 (×2): 2 g via INTRAVENOUS
  Filled 2020-11-16 (×2): qty 100

## 2020-11-16 MED ORDER — HYDROMORPHONE HCL 1 MG/ML IJ SOLN
0.5000 mg | INTRAMUSCULAR | Status: DC | PRN
  Administered 2020-11-16 – 2020-11-17 (×2): 1 mg via INTRAVENOUS
  Filled 2020-11-16 (×2): qty 1

## 2020-11-16 MED ORDER — MAGNESIUM CITRATE PO SOLN
1.0000 | Freq: Once | ORAL | Status: DC | PRN
  Filled 2020-11-16: qty 296

## 2020-11-16 MED ORDER — METHOCARBAMOL 500 MG PO TABS
500.0000 mg | ORAL_TABLET | Freq: Four times a day (QID) | ORAL | Status: DC | PRN
  Administered 2020-11-16: 500 mg via ORAL
  Filled 2020-11-16: qty 1

## 2020-11-16 MED ORDER — PHENYLEPHRINE 40 MCG/ML (10ML) SYRINGE FOR IV PUSH (FOR BLOOD PRESSURE SUPPORT)
PREFILLED_SYRINGE | INTRAVENOUS | Status: DC | PRN
  Administered 2020-11-16 (×5): 80 ug via INTRAVENOUS

## 2020-11-16 MED ORDER — TRANEXAMIC ACID 1000 MG/10ML IV SOLN
2000.0000 mg | INTRAVENOUS | Status: DC
  Filled 2020-11-16 (×3): qty 20

## 2020-11-16 MED ORDER — ROPIVACAINE HCL 2 MG/ML IJ SOLN
8.0000 mL/h | INTRAMUSCULAR | Status: DC
  Administered 2020-11-16: 8 mL/h via EPIDURAL
  Filled 2020-11-16 (×2): qty 200

## 2020-11-16 MED ORDER — ONDANSETRON HCL 4 MG/2ML IJ SOLN: 4.0000 mg | Freq: Four times a day (QID) | INTRAMUSCULAR | Status: DC | PRN

## 2020-11-16 MED ORDER — METOCLOPRAMIDE HCL 5 MG/ML IJ SOLN: 5.0000 mg | Freq: Three times a day (TID) | INTRAMUSCULAR | Status: DC | PRN

## 2020-11-16 MED ORDER — ALBUMIN HUMAN 5 % IV SOLN: 12.5000 g | Freq: Once | INTRAVENOUS | Status: AC

## 2020-11-16 SURGICAL SUPPLY — 64 items
BAG COUNTER SPONGE SURGICOUNT (BAG) ×6 IMPLANT
BAG DECANTER FOR FLEXI CONT (MISCELLANEOUS) ×9 IMPLANT
CELLS DAT CNTRL 66122 CELL SVR (MISCELLANEOUS) IMPLANT
CLSR STERI-STRIP ANTIMIC 1/2X4 (GAUZE/BANDAGES/DRESSINGS) ×3 IMPLANT
COVER PERINEAL POST (MISCELLANEOUS) ×3 IMPLANT
COVER SURGICAL LIGHT HANDLE (MISCELLANEOUS) ×6 IMPLANT
DRAPE C-ARM 42X72 X-RAY (DRAPES) ×3 IMPLANT
DRAPE POUCH INSTRU U-SHP 10X18 (DRAPES) ×6 IMPLANT
DRAPE STERI IOBAN 125X83 (DRAPES) ×6 IMPLANT
DRAPE U-SHAPE 47X51 STRL (DRAPES) ×12 IMPLANT
DRESSING AQUACEL AG SP 3.5X10 (GAUZE/BANDAGES/DRESSINGS) ×4 IMPLANT
DRSG AQUACEL AG SP 3.5X10 (GAUZE/BANDAGES/DRESSINGS) ×6
DURAPREP 26ML APPLICATOR (WOUND CARE) ×12 IMPLANT
ELECT BLADE 4.0 EZ CLEAN MEGAD (MISCELLANEOUS) ×6
ELECT PENCIL ROCKER SW 15FT (MISCELLANEOUS) ×6 IMPLANT
ELECT REM PT RETURN 9FT ADLT (ELECTROSURGICAL) ×6
ELECTRODE BLDE 4.0 EZ CLN MEGD (MISCELLANEOUS) ×4 IMPLANT
ELECTRODE REM PT RTRN 9FT ADLT (ELECTROSURGICAL) ×4 IMPLANT
GLOVE SURG LTX SZ7 (GLOVE) ×6 IMPLANT
GLOVE SURG NEOP MICRO LF SZ7.5 (GLOVE) ×6 IMPLANT
GLOVE SURG SYN 7.5  E (GLOVE) ×8
GLOVE SURG SYN 7.5 E (GLOVE) ×16 IMPLANT
GLOVE SURG UNDER POLY LF SZ7 (GLOVE) ×21 IMPLANT
GOWN STRL REIN XL XLG (GOWN DISPOSABLE) ×12 IMPLANT
GOWN STRL REUS W/ TWL LRG LVL3 (GOWN DISPOSABLE) ×2 IMPLANT
GOWN STRL REUS W/ TWL XL LVL3 (GOWN DISPOSABLE) ×4 IMPLANT
GOWN STRL REUS W/TWL LRG LVL3 (GOWN DISPOSABLE) ×1
GOWN STRL REUS W/TWL XL LVL3 (GOWN DISPOSABLE) ×2
HANDPIECE INTERPULSE COAX TIP (DISPOSABLE) ×2
HEAD CERAMIC 36 PLUS 8.5 12 14 (Hips) ×3 IMPLANT
HEAD CERAMIC 36 PLUS5 (Hips) ×3 IMPLANT
HOOD PEEL AWAY FLYTE STAYCOOL (MISCELLANEOUS) ×12 IMPLANT
IV NS IRRIG 3000ML ARTHROMATIC (IV SOLUTION) ×6 IMPLANT
JET LAVAGE IRRISEPT WOUND (IRRIGATION / IRRIGATOR) ×6
KIT BASIN OR (CUSTOM PROCEDURE TRAY) ×3 IMPLANT
LAVAGE JET IRRISEPT WOUND (IRRIGATION / IRRIGATOR) ×4 IMPLANT
LINER NEUTRAL 52X36MM PLUS 4 (Liner) ×6 IMPLANT
MARKER SKIN DUAL TIP RULER LAB (MISCELLANEOUS) ×6 IMPLANT
NEEDLE SPNL 18GX3.5 QUINCKE PK (NEEDLE) ×6 IMPLANT
PACK TOTAL JOINT (CUSTOM PROCEDURE TRAY) ×6 IMPLANT
PACK UNIVERSAL I (CUSTOM PROCEDURE TRAY) ×6 IMPLANT
PIN SECTOR W/GRIP ACE CUP 52MM (Hips) ×6 IMPLANT
RTRCTR WOUND ALEXIS 18CM MED (MISCELLANEOUS)
SAW OSC TIP CART 19.5X105X1.3 (SAW) ×6 IMPLANT
SCREW 6.5MMX25MM (Screw) ×6 IMPLANT
SET HNDPC FAN SPRY TIP SCT (DISPOSABLE) ×4 IMPLANT
STAPLER VISISTAT 35W (STAPLE) IMPLANT
STEM FEMORAL SZ5 HIGH ACTIS (Stem) ×3 IMPLANT
STEM FEMORAL SZ6 HIGH ACTIS (Stem) ×3 IMPLANT
SUT ETHIBOND 2 V 37 (SUTURE) ×6 IMPLANT
SUT MNCRL+ AB 3-0 CT1 36 (SUTURE) ×4 IMPLANT
SUT MONOCRYL AB 3-0 CT1 36IN (SUTURE) ×2
SUT VIC AB 0 CT1 27 (SUTURE) ×2
SUT VIC AB 0 CT1 27XBRD ANBCTR (SUTURE) ×4 IMPLANT
SUT VIC AB 1 CTX 36 (SUTURE) ×2
SUT VIC AB 1 CTX36XBRD ANBCTR (SUTURE) ×4 IMPLANT
SUT VIC AB 2-0 CT1 27 (SUTURE) ×4
SUT VIC AB 2-0 CT1 TAPERPNT 27 (SUTURE) ×8 IMPLANT
SYR 50ML LL SCALE MARK (SYRINGE) ×6 IMPLANT
TOWEL GREEN STERILE (TOWEL DISPOSABLE) ×6 IMPLANT
TRAY CATH 16FR W/PLASTIC CATH (SET/KITS/TRAYS/PACK) ×3 IMPLANT
TRAY FOLEY W/BAG SLVR 16FR (SET/KITS/TRAYS/PACK) ×1
TRAY FOLEY W/BAG SLVR 16FR ST (SET/KITS/TRAYS/PACK) ×2 IMPLANT
YANKAUER SUCT BULB TIP NO VENT (SUCTIONS) ×6 IMPLANT

## 2020-11-16 NOTE — Op Note (Signed)
LEFT ANTERIOR TOTAL HIP ARTHROPLASTY, RIGHT TOTAL HIP ARTHROPLASTY  Procedure Note Lewin Pellow   [id number]  Pre-op Diagnosis: BILATERAL HIP DEGENERATIVE JOINT DISEASE     Post-op Diagnosis: same   Operative Procedures: Bilateral total hip replacements. CPT 78469  Surgeon: Gershon Mussel, M.D.  Assist: Oneal Grout, PA-C   Anesthesia: spinal, epidural, local  Left hip: Prosthesis: Depuy Acetabulum: Pinnacle 52 mm Femur: Actis 6 HO Head: 36 mm size: +8.5 Liner: +4 Bearing Type: ceramic/poly  Right hip: Acetabulum: Pinnacle 52 mm Femur: Actis 5 HO Head: 36 mm size: +5 Liner: +4 Bearing Type: ceramic/poly  Total Hip Arthroplasty (Anterior Approach) Op Note:  After informed consent was obtained and the operative extremity marked in the holding area, the patient was brought back to the operating room and placed supine on the HANA table. We began with the left hip.  Next, the operative extremity was prepped and draped in normal sterile fashion. Surgical timeout occurred verifying patient identification, surgical site, surgical procedure and administration of antibiotics.  A modified anterior Smith-Peterson approach to the hip was performed, using the interval between tensor fascia lata and sartorius.  Dissection was carried bluntly down onto the anterior hip capsule. The lateral femoral circumflex vessels were identified and coagulated. A capsulotomy was performed and the capsular flaps tagged for later repair.  The neck osteotomy was performed. The femoral head was removed which showed severe wear and osteophytes, the acetabular rim was cleared of soft tissue and attention was turned to reaming the acetabulum.  Sequential reaming was performed under fluoroscopic guidance. We reamed to a size 51 mm, and then impacted the acetabular shell. A 25 mm cancellous screw was placed through the shell for added fixation.  The liner was then placed after irrigation and attention turned to  the femur.  After placing the femoral hook, the leg was taken to externally rotated, extended and adducted position taking care to perform soft tissue releases to allow for adequate mobilization of the femur. Soft tissue was cleared from the shoulder of the greater trochanter and the hook elevator used to improve exposure of the proximal femur. Sequential broaching performed up to a size 6. Trial neck and head were placed. The leg was brought back up to neutral and the construct reduced.  Antibiotic irrigation was placed in the surgical wound and kept for at least 1 minute.  The position and sizing of components, offset and leg lengths were checked using fluoroscopy. Stability of the construct was checked in extension and external rotation without any subluxation or impingement of prosthesis. We dislocated the prosthesis, dropped the leg back into position, removed trial components, and irrigated copiously. The final stem and head were then placed, the leg brought back up, the system reduced and fluoroscopy used to verify positioning.  We irrigated, obtained hemostasis and closed the capsule using #2 ethibond suture.  One gram of vancomycin powder was placed in the surgical bed.   One gram of topical tranexamic acid was injected into the joint.  The fascia was closed with #1 vicryl plus, the deep fat layer was closed with 0 vicryl, the subcutaneous layers closed with 2.0 Vicryl Plus and the skin closed with 3.0 monocryl. A sterile dressing was applied.   We then re-prepped and draped the right hip for surgery.  The steps of the surgery were repeated as mentioned above.  The size of the components are listed above.  The surgical site was closed in a layered fashion with the same sutures  and dressing.    The patient was awakened in the operating room and taken to recovery in stable condition.  All sponge, needle, and instrument counts were correct at the end of the case.   Tessa Lerner, my PA, was a  medical necessity for opening, closing, limb positioning, retracting, exposing, and overall facilitation and timely completion of the surgery.  Position: supine  Complications: see description of procedure.  Time Out: performed   Drains/Packing: none  Estimated blood loss: see anesthesia record  Returned to Recovery Room: in good condition.   Antibiotics: yes   Mechanical VTE (DVT) Prophylaxis: sequential compression devices, TED thigh-high  Chemical VTE (DVT) Prophylaxis: xarelto to begin after removal of epidural catheter   Fluid Replacement: see anesthesia record  Specimens Removed: 1 to pathology   Sponge and Instrument Count Correct? yes   PACU: portable radiograph - low AP   Plan/RTC: Return in 2 weeks for staple removal. Weight Bearing/Load Lower Extremity: full  Hip precautions: none Suture Removal: 2 weeks   N. Glee Arvin, MD [facility] 9:07 AM   Implant Name Type Inv. Item Serial No. Manufacturer Lot No. LRB No. Used Action  LINER NEUTRAL [id number] PLUS 4 - [id number] Liner LINER NEUTRAL 52X36MM PLUS 4  [employer] [id number] Left 1 Implanted  PIN SECTOR W/GRIP ACE CUP - [id number] Hips PIN SECTOR W/GRIP ACE CUP  [employer] [id number] Left 1 Implanted  SCREW 6.5MMX25MM - [id number] Screw SCREW 6.5MMX25MM  [employer] [id number] Left 1 Implanted  STEM FEMORAL SZ6 HIGH ACTIS - [id number] Stem STEM FEMORAL SZ6 HIGH ACTIS  [employer] [id number] Left 1 Implanted  HEAD CERAMIC 36 PLUS 8.5 12 14  - [id number] Hips HEAD CERAMIC 36 PLUS 8.5 12 14   [employer] [id number] Left 1 Implanted

## 2020-11-16 NOTE — H&P (Signed)
PREOPERATIVE H&P  Chief Complaint: BILATERAL HIP DEGENERATIVE JOING DISEASE  HPI: Jesus Hammond is a 55 y.o. male who presents for surgical treatment of BILATERAL HIP DEGENERATIVE JOING DISEASE.  He denies any changes in medical history.  Past Medical History:  Diagnosis Date   Arthritis    bilateral hips   Hypertension    Past Surgical History:  Procedure Laterality Date   HERNIA REPAIR Left    inguinal hernia   Social History   Socioeconomic History   Marital status: Unknown    Spouse name: Not on file   Number of children: Not on file   Years of education: Not on file   Highest education level: Not on file  Occupational History   Not on file  Tobacco Use   Smoking status: Never   Smokeless tobacco: Never  Vaping Use   Vaping Use: Never used  Substance and Sexual Activity   Alcohol use: Yes    Comment: 2 a week   Drug use: Never   Sexual activity: Not on file  Other Topics Concern   Not on file  Social History Narrative   Not on file   Social Determinants of Health   Financial Resource Strain: Not on file  Food Insecurity: Not on file  Transportation Needs: Not on file  Physical Activity: Not on file  Stress: Not on file  Social Connections: Not on file   No family history on file. Allergies  Allergen Reactions   Penicillins Other (See Comments)    Unknown - episode happened during childhood, thinks possible rash. As an Infant Other reaction(s): Other As an Infant    Prior to Admission medications   Medication Sig Start Date End Date Taking? Authorizing Provider  ibuprofen (ADVIL) 200 MG tablet Take 400 mg by mouth every 6 (six) hours as needed for mild pain or moderate pain.   Yes [provider]  pseudoephedrine-acetaminophen (TYLENOL SINUS) 30-500 MG TABS tablet Take 1 tablet by mouth every 4 (four) hours as needed (sinus problems).   Yes [provider]  aspirin EC 81 MG tablet Take 1 tablet (81 mg total) by mouth 2  (two) times daily. To be taken after surgery 11/13/20   Cristie Hem, PA-C  docusate sodium (COLACE) 100 MG capsule Take 1 capsule (100 mg total) by mouth daily as needed. 11/13/20 11/13/21  Cristie Hem, PA-C  hydrochlorothiazide (HYDRODIURIL) 25 MG tablet Take 25 mg by mouth daily. 11/12/20   Salomon Mast, PA-C  lisinopril (ZESTRIL) 40 MG tablet Take 40 mg by mouth daily. 11/12/20   Salomon Mast, PA-C  methocarbamol (ROBAXIN) 500 MG tablet Take 1 tablet (500 mg total) by mouth 2 (two) times daily as needed. To be taken after surgery 11/13/20   Cristie Hem, PA-C  ondansetron (ZOFRAN) 4 MG tablet Take 1 tablet (4 mg total) by mouth every 8 (eight) hours as needed for nausea or vomiting. 11/13/20   Cristie Hem, PA-C  oxyCODONE-acetaminophen (PERCOCET) 5-325 MG tablet Take 1-2 tablets by mouth every 6 (six) hours as needed. To be taken after surgery 11/13/20   Cristie Hem, PA-C     Positive ROS: All other systems have been reviewed and were otherwise negative with the exception of those mentioned in the HPI and as above.  Physical Exam: General: Alert, no acute distress Cardiovascular: No pedal edema Respiratory: No cyanosis, no use of accessory musculature GI: abdomen soft Skin: No lesions in the area of chief complaint Neurologic:  Sensation intact distally Psychiatric: Patient is competent for consent with normal mood and affect Lymphatic: no lymphedema  MUSCULOSKELETAL: exam stable  Assessment: BILATERAL HIP DEGENERATIVE JOING DISEASE  Plan: Plan for Procedure(s): BILATERAL ANTERIOR TOTAL HIP ARTHROPLASTY  The risks benefits and alternatives were discussed with the patient including but not limited to the risks of nonoperative treatment, versus surgical intervention including infection, bleeding, nerve injury,  blood clots, cardiopulmonary complications, morbidity, mortality, among others, and they were willing to proceed.   Preoperative templating of the joint  replacement has been completed, documented, and submitted to the Operating Room personnel in order to optimize intra-operative equipment management.   Glee Arvin, MD 11/16/2020 6:06 AM

## 2020-11-16 NOTE — Transfer of Care (Signed)
Immediate Anesthesia Transfer of Care Note  Patient: [name]  Procedure(s) Performed: LEFT ANTERIOR TOTAL HIP ARTHROPLASTY (Left: Hip) RIGHT TOTAL HIP ARTHROPLASTY (Right: Hip)  Patient Location: PACU  Anesthesia Type:MAC and Spinal  Level of Consciousness: alert , oriented and patient cooperative  Airway & Oxygen Therapy: Patient Spontanous Breathing and Patient connected to face mask oxygen  Post-op Assessment: Report given to RN and Post -op Vital signs reviewed and stable  Post vital signs: Reviewed  Last Vitals:  Vitals Value Taken Time  BP    Temp    Pulse 73 11/16/20 1135  Resp 20 11/16/20 1135  SpO2 95 % 11/16/20 1135  Vitals shown include unvalidated device data.  Last Pain:  Vitals:   11/16/20 0631  TempSrc:   PainSc: 2       Patients Stated Pain Goal: 3 (41/71/27 8718)  Complications: No notable events documented.

## 2020-11-16 NOTE — Anesthesia Postprocedure Evaluation (Signed)
Anesthesia Post Note  Patient: Jesus Hammond  Procedure(s) Performed: LEFT ANTERIOR TOTAL HIP ARTHROPLASTY (Left: Hip) RIGHT TOTAL HIP ARTHROPLASTY (Right: Hip)     Patient location during evaluation: PACU Anesthesia Type: Combined Spinal/Epidural Level of consciousness: oriented and awake and alert Pain management: pain level controlled Vital Signs Assessment: post-procedure vital signs reviewed and stable Respiratory status: spontaneous breathing, respiratory function stable and patient connected to nasal cannula oxygen Cardiovascular status: blood pressure returned to baseline and stable Postop Assessment: no headache, no backache and no apparent nausea or vomiting Anesthetic complications: no   No notable events documented.  Last Vitals:  Vitals:   11/16/20 1550 11/16/20 1614  BP: 122/72 117/90  Pulse: 90 93  Resp: 14 18  Temp: 36.8 C 36.8 C  SpO2: 99% 98%    Last Pain:  Vitals:   11/16/20 1631  TempSrc:   PainSc: 0-No pain                 [name]

## 2020-11-16 NOTE — Progress Notes (Deleted)
Notified by Gaynelle Adu, RN of the potential for patient to transfer to [facility] with epidural. After discussing with 3C RN Ellan Lambert) and pharmacy clinical coordinator Zena Amos, 3C does not normally provide the monitoring required of an epidural patient (for ex. Continuous pulse-ox). Notified AC Kim and discussed with male PACU RN. Alternative placement to be considered if possible.   Miu Chiong S. Merilynn Finland, PharmD, BCPS Clinical Staff Pharmacist [email or web address]

## 2020-11-16 NOTE — Evaluation (Signed)
Physical Therapy Evaluation Patient Details Name: Jesus Hammond MRN: [id number] DOB: [birth year] Today's Date: 11/16/2020   History of Present Illness  Pt adm 7/18 for bil THR direct anterior approach. PMH - HTN, arthritis  Clinical Impression  Pt admitted with above diagnosis and presents to PT with functional limitations due to deficits listed below (See PT problem list). Pt needs skilled PT to maximize independence and safety. Pt motivated to return home and to eventually return to more active lifestyle.    Follow Up Recommendations Follow surgeon's recommendation for DC plan and follow-up therapies    Equipment Recommendations  Rolling walker with 5" wheels;3in1 (PT)    Recommendations for Other Services       Precautions / Restrictions Precautions Precautions: Fall;Other (comment) Precaution Comments: Pt with epidural pain control Restrictions Weight Bearing Restrictions: Yes RLE Weight Bearing: Weight bearing as tolerated LLE Weight Bearing: Weight bearing as tolerated      Mobility  Bed Mobility Overal bed mobility: Needs Assistance Bed Mobility: Supine to Sit;Sit to Supine     Supine to sit: Min assist;HOB elevated Sit to supine: Min assist   General bed mobility comments: Assist to bring legs off bed and elevate trunk into sitting. assist to bring legs back up into bed returning to supine    Transfers Overall transfer level: Needs assistance Equipment used: Rolling walker (2 wheeled) Transfers: Sit to/from Stand Sit to Stand: Min assist         General transfer comment: Assist to bring hips up. Verbal cues for hand placement  Ambulation/Gait             General Gait Details: Pt stood and wt shifted but no steps taken due to nausea  Stairs            Wheelchair Mobility    Modified Rankin (Stroke Patients Only)       Balance Overall balance assessment: No apparent balance deficits (not formally assessed)                                            Pertinent Vitals/Pain Pain Assessment: 0-10 Pain Score: 6  Pain Location: bilateral hips Pain Descriptors / Indicators: Aching;Throbbing Pain Intervention(s): Limited activity within patient's tolerance;Monitored during session;Repositioned    Home Living Family/patient expects to be discharged to:: Private residence Living Arrangements: Alone Available Help at Discharge: Family;Available 24 hours/day (parents, significant other, children) Type of Home: House Home Access: Stairs to enter Entrance Stairs-Rails: None Entrance Stairs-Number of Steps: 2 (small) Home Layout: One level Home Equipment: None      Prior Function Level of Independence: Independent               Hand Dominance        Extremity/Trunk Assessment   Upper Extremity Assessment Upper Extremity Assessment: Overall WFL for tasks assessed    Lower Extremity Assessment Lower Extremity Assessment: RLE deficits/detail;LLE deficits/detail RLE Deficits / Details: Limited by pain s/p THR. Good quad set LLE Deficits / Details: Limited by pain s/p THR. Good quad set       Communication   Communication: No difficulties  Cognition Arousal/Alertness: Awake/alert Behavior During Therapy: WFL for tasks assessed/performed Overall Cognitive Status: Within Functional Limits for tasks assessed  General Comments      Exercises     Assessment/Plan    PT Assessment Patient needs continued PT services  PT Problem List Decreased strength;Decreased activity tolerance;Decreased mobility;Decreased knowledge of use of DME;Pain       PT Treatment Interventions DME instruction;Gait training;Stair training;Functional mobility training;Therapeutic activities;Therapeutic exercise;Patient/family education    PT Goals (Current goals can be found in the Care Plan section)  Acute Rehab PT Goals Patient Stated Goal: be active  again PT Goal Formulation: With patient Time For Goal Achievement: 11/20/20 Potential to Achieve Goals: Good    Frequency 7X/week   Barriers to discharge        Co-evaluation               AM-PAC PT "6 Clicks" Mobility  Outcome Measure Help needed turning from your back to your side while in a flat bed without using bedrails?: A Little Help needed moving from lying on your back to sitting on the side of a flat bed without using bedrails?: A Little Help needed moving to and from a bed to a chair (including a wheelchair)?: Total Help needed standing up from a chair using your arms (e.g., wheelchair or bedside chair)?: A Little Help needed to walk in hospital room?: Total Help needed climbing 3-5 steps with a railing? : Total 6 Click Score: 12    End of Session Equipment Utilized During Treatment: Gait belt Activity Tolerance: Other (comment) (Limited by nausea) Patient left: in bed;with call bell/phone within reach;with bed alarm set;with family/visitor present Nurse Communication: Mobility status PT Visit Diagnosis: Other abnormalities of gait and mobility (R26.89);Pain Pain - Right/Left:  (bilatera) Pain - part of body: Hip    Time: 1715-1750 PT Time Calculation (min) (ACUTE ONLY): 35 min   Charges:   PT Evaluation $PT Eval Low Complexity: 1 Low PT Treatments $Therapeutic Activity: 8-22 mins        [facility] PT Acute Rehabilitation Services Pager [phone] Office [phone]   Angelina Ok [facility] 11/16/2020, 6:35 PM

## 2020-11-16 NOTE — Anesthesia Procedure Notes (Signed)
Procedure Name: MAC Date/Time: 11/16/2020 7:20 AM Performed by: Jenne Campus, CRNA Pre-anesthesia Checklist: Patient identified, Emergency Drugs available, Suction available and Patient being monitored Oxygen Delivery Method: Simple face mask

## 2020-11-16 NOTE — Discharge Instructions (Addendum)
INSTRUCTIONS AFTER JOINT REPLACEMENT   Remove items at home which could result in a fall. This includes throw rugs or furniture in walking pathways ICE to the affected joint every three hours while awake for 30 minutes at a time, for at least the first 3-5 days, and then as needed for pain and swelling.  Continue to use ice for pain and swelling. You may notice swelling that will progress down to the foot and ankle.  This is normal after surgery.  Elevate your leg when you are not up walking on it.   Continue to use the breathing machine you got in the hospital (incentive spirometer) which will help keep your temperature down.  It is common for your temperature to cycle up and down following surgery, especially at night when you are not up moving around and exerting yourself.  The breathing machine keeps your lungs expanded and your temperature down.   DIET:  As you were doing prior to hospitalization, we recommend a well-balanced diet.  DRESSING / WOUND CARE / SHOWERING  Keep the surgical dressing until follow up.  The dressing is water proof, so you can shower without any extra covering.  IF THE DRESSING FALLS OFF or the wound gets wet inside, change the dressing with sterile gauze.  Please use good hand washing techniques before changing the dressing.  Do not use any lotions or creams on the incision until instructed by your surgeon.    ACTIVITY  Increase activity slowly as tolerated, but follow the weight bearing instructions below.   No driving for 6 weeks or until further direction given by your physician.  You cannot drive while taking narcotics.  No lifting or carrying greater than 10 lbs. until further directed by your surgeon. Avoid periods of inactivity such as sitting longer than an hour when not asleep. This helps prevent blood clots.  You may return to work once you are authorized by your doctor.     WEIGHT BEARING   Weight bearing as tolerated with assist device (walker, cane,  etc) as directed, use it as long as suggested by your surgeon or therapist, typically at least 4-6 weeks.   EXERCISES  Results after joint replacement surgery are often greatly improved when you follow the exercise, range of motion and muscle strengthening exercises prescribed by your doctor. Safety measures are also important to protect the joint from further injury. Any time any of these exercises cause you to have increased pain or swelling, decrease what you are doing until you are comfortable again and then slowly increase them. If you have problems or questions, call your caregiver or physical therapist for advice.   Rehabilitation is important following a joint replacement. After just a few days of immobilization, the muscles of the leg can become weakened and shrink (atrophy).  These exercises are designed to build up the tone and strength of the thigh and leg muscles and to improve motion. Often times heat used for twenty to thirty minutes before working out will loosen up your tissues and help with improving the range of motion but do not use heat for the first two weeks following surgery (sometimes heat can increase post-operative swelling).   These exercises can be done on a training (exercise) mat, on the floor, on a table or on a bed. Use whatever works the best and is most comfortable for you.    Use music or television while you are exercising so that the exercises are a pleasant break in your   day. This will make your life better with the exercises acting as a break in your routine that you can look forward to.   Perform all exercises about fifteen times, three times per day or as directed.  You should exercise both the operative leg and the other leg as well.  Exercises include:   Quad Sets - Tighten up the muscle on the front of the thigh (Quad) and hold for 5-10 seconds.   Straight Leg Raises - With your knee straight (if you were given a brace, keep it on), lift the leg to 60  degrees, hold for 3 seconds, and slowly lower the leg.  Perform this exercise against resistance later as your leg gets stronger.  Leg Slides: Lying on your back, slowly slide your foot toward your buttocks, bending your knee up off the floor (only go as far as is comfortable). Then slowly slide your foot back down until your leg is flat on the floor again.  Angel Wings: Lying on your back spread your legs to the side as far apart as you can without causing discomfort.  Hamstring Strength:  Lying on your back, push your heel against the floor with your leg straight by tightening up the muscles of your buttocks.  Repeat, but this time bend your knee to a comfortable angle, and push your heel against the floor.  You may put a pillow under the heel to make it more comfortable if necessary.   A rehabilitation program following joint replacement surgery can speed recovery and prevent re-injury in the future due to weakened muscles. Contact your doctor or a physical therapist for more information on knee rehabilitation.    CONSTIPATION  Constipation is defined medically as fewer than three stools per week and severe constipation as less than one stool per week.  Even if you have a regular bowel pattern at home, your normal regimen is likely to be disrupted due to multiple reasons following surgery.  Combination of anesthesia, postoperative narcotics, change in appetite and fluid intake all can affect your bowels.   YOU MUST use at least one of the following options; they are listed in order of increasing strength to get the job done.  They are all available over the counter, and you may need to use some, POSSIBLY even all of these options:    Drink plenty of fluids (prune juice may be helpful) and high fiber foods Colace 100 mg by mouth twice a day  Senokot for constipation as directed and as needed Dulcolax (bisacodyl), take with full glass of water  Miralax (polyethylene glycol) once or twice a day as  needed.  If you have tried all these things and are unable to have a bowel movement in the first 3-4 days after surgery call either your surgeon or your primary doctor.    If you experience loose stools or diarrhea, hold the medications until you stool forms back up.  If your symptoms do not get better within 1 week or if they get worse, check with your doctor.  If you experience "the worst abdominal pain ever" or develop nausea or vomiting, please contact the office immediately for further recommendations for treatment.   ITCHING:  If you experience itching with your medications, try taking only a single pain pill, or even half a pain pill at a time.  You can also use Benadryl over the counter for itching or also to help with sleep.   TED HOSE STOCKINGS:  Use stockings on both   legs until for at least 2 weeks or as directed by physician office. They may be removed at night for sleeping.  MEDICATIONS:  See your medication summary on the "After Visit Summary" that nursing will review with you.  You may have some home medications which will be placed on hold until you complete the course of blood thinner medication.  It is important for you to complete the blood thinner medication as prescribed.  PRECAUTIONS:  If you experience chest pain or shortness of breath - call 911 immediately for transfer to the hospital emergency department.   If you develop a fever greater that 101 F, purulent drainage from wound, increased redness or drainage from wound, foul odor from the wound/dressing, or calf pain - CONTACT YOUR SURGEON.                                                   FOLLOW-UP APPOINTMENTS:  If you do not already have a post-op appointment, please call the office for an appointment to be seen by your surgeon.  Guidelines for how soon to be seen are listed in your "After Visit Summary", but are typically between 1-4 weeks after surgery.  OTHER INSTRUCTIONS:   Knee Replacement:  Do not place pillow  under knee, focus on keeping the knee straight while resting. CPM instructions: 0-90 degrees, 2 hours in the morning, 2 hours in the afternoon, and 2 hours in the evening. Place foam block, curve side up under heel at all times except when in CPM or when walking.  DO NOT modify, tear, cut, or change the foam block in any way.  POST-OPERATIVE OPIOID TAPER INSTRUCTIONS: It is important to wean off of your opioid medication as soon as possible. If you do not need pain medication after your surgery it is ok to stop day one. Opioids include: Codeine, Hydrocodone(Norco, Vicodin), Oxycodone(Percocet, oxycontin) and hydromorphone amongst others.  Long term and even short term use of opiods can cause: Increased pain response Dependence Constipation Depression Respiratory depression And more.  Withdrawal symptoms can include Flu like symptoms Nausea, vomiting And more Techniques to manage these symptoms Hydrate well Eat regular healthy meals Stay active Use relaxation techniques(deep breathing, meditating, yoga) Do Not substitute Alcohol to help with tapering If you have been on opioids for less than two weeks and do not have pain than it is ok to stop all together.  Plan to wean off of opioids This plan should start within one week post op of your joint replacement. Maintain the same interval or time between taking each dose and first decrease the dose.  Cut the total daily intake of opioids by one tablet each day Next start to increase the time between doses. The last dose that should be eliminated is the evening dose.   MAKE SURE YOU:  Understand these instructions.  Get help right away if you are not doing well or get worse.    Thank you for letting us be a part of your medical care team.  It is a privilege we respect greatly.  We hope these instructions will help you stay on track for a fast and full recovery!     Information on my medicine - XARELTO (Rivaroxaban)  This  medication education was reviewed with me or my healthcare representative as part of my discharge preparation.      Why was Xarelto prescribed for you? Xarelto was prescribed for you to reduce the risk of blood clots forming after orthopedic surgery. The medical term for these abnormal blood clots is venous thromboembolism (VTE).  What do you need to know about xarelto ? Take your Xarelto ONCE DAILY at the same time every day. You may take it either with or without food.  If you have difficulty swallowing the tablet whole, you may crush it and mix in applesauce just prior to taking your dose.  Take Xarelto exactly as prescribed by your doctor and DO NOT stop taking Xarelto without talking to the doctor who prescribed the medication.  Stopping without other VTE prevention medication to take the place of Xarelto may increase your risk of developing a clot.  After discharge, you should have regular check-up appointments with your healthcare provider that is prescribing your Xarelto.    What do you do if you miss a dose? If you miss a dose, take it as soon as you remember on the same day then continue your regularly scheduled once daily regimen the next day. Do not take two doses of Xarelto on the same day.   Important Safety Information A possible side effect of Xarelto is bleeding. You should call your healthcare provider right away if you experience any of the following: Bleeding from an injury or your nose that does not stop. Unusual colored urine (red or dark brown) or unusual colored stools (red or black). Unusual bruising for unknown reasons. A serious fall or if you hit your head (even if there is no bleeding).  Some medicines may interact with Xarelto and might increase your risk of bleeding while on Xarelto. To help avoid this, consult your healthcare provider or pharmacist prior to using any new prescription or non-prescription medications, including herbals, vitamins,  non-steroidal anti-inflammatory drugs (NSAIDs) and supplements.  This website has more information on Xarelto: www.xarelto.com.   

## 2020-11-16 NOTE — Anesthesia Procedure Notes (Signed)
Spinal  Patient location during procedure: OR Start time: 11/16/2020 7:15 AM End time: 11/16/2020 7:25 AM Reason for block: at surgeon's request Staffing Performed: anesthesiologist  Anesthesiologist: Elmer Picker, MD Preanesthetic Checklist Completed: patient identified, IV checked, risks and benefits discussed, surgical consent, monitors and equipment checked, pre-op evaluation and timeout performed Spinal Block Patient position: sitting Prep: DuraPrep and site prepped and draped Patient monitoring: cardiac monitor, continuous pulse ox and blood pressure Approach: midline Location: L3-4 Injection technique: single-shot Needle Needle type: Pencan and Tuohy  Needle gauge: 25 G Needle length: 9 cm Assessment Sensory level: T6 Events: CSF return Additional Notes Functioning IV was confirmed and monitors were applied. Sterile prep and drape, including hand hygiene and sterile gloves were used. The patient was positioned and the spine was prepped. The skin was anesthetized with lidocaine.  Free flow of clear CSF was obtained prior to injecting local anesthetic into the CSF.  The spinal needle aspirated freely following injection.  The needle was carefully withdrawn.  The patient tolerated the procedure well.   CSE performed without complication. Tuohy advanced into epidural space. LOR at 5cm. Pencan inserted into intrathecal space. CSF present. Medications administered intrathecally as per chart. Epidural catheter advanced into epidural space and left at 10cm at skin. Pt tolerated procedure well. VSS.

## 2020-11-17 ENCOUNTER — Encounter (HOSPITAL_COMMUNITY): Payer: Self-pay | Admitting: Orthopaedic Surgery

## 2020-11-17 DIAGNOSIS — M160 Bilateral primary osteoarthritis of hip: Secondary | ICD-10-CM | POA: Diagnosis not present

## 2020-11-17 LAB — CBC
HCT: 35.1 % — ABNORMAL LOW (ref 39.0–52.0)
Hemoglobin: 11.6 g/dL — ABNORMAL LOW (ref 13.0–17.0)
MCH: 27.2 pg (ref 26.0–34.0)
MCHC: 33 g/dL (ref 30.0–36.0)
MCV: 82.4 fL (ref 80.0–100.0)
Platelets: 229 10*3/uL (ref 150–400)
RBC: 4.26 MIL/uL (ref 4.22–5.81)
RDW: 13.2 % (ref 11.5–15.5)
WBC: 12.1 10*3/uL — ABNORMAL HIGH (ref 4.0–10.5)
nRBC: 0 % (ref 0.0–0.2)

## 2020-11-17 LAB — BASIC METABOLIC PANEL
Anion gap: 6 (ref 5–15)
BUN: 9 mg/dL (ref 6–20)
CO2: 25 mmol/L (ref 22–32)
Calcium: 8.6 mg/dL — ABNORMAL LOW (ref 8.9–10.3)
Chloride: 103 mmol/L (ref 98–111)
Creatinine, Ser: 0.78 mg/dL (ref 0.61–1.24)
GFR, Estimated: >60 mL/min (ref >60)
Glucose, Bld: 119 mg/dL — ABNORMAL HIGH (ref 70–99)
Potassium: 3.7 mmol/L (ref 3.5–5.1)
Sodium: 134 mmol/L — ABNORMAL LOW (ref 135–145)

## 2020-11-17 MED ORDER — RIVAROXABAN 10 MG PO TABS
10.0000 mg | ORAL_TABLET | Freq: Every day | ORAL | Status: DC
  Administered 2020-11-17 – 2020-11-18 (×2): 10 mg via ORAL
  Filled 2020-11-17 (×2): qty 1

## 2020-11-17 MED ORDER — ASPIRIN EC 81 MG PO TBEC: 81.0000 mg | DELAYED_RELEASE_TABLET | Freq: Two times a day (BID) | ORAL | 0 refills | Status: DC

## 2020-11-17 NOTE — Addendum Note (Signed)
Addendum  created 11/17/20 0843 by Achille Rich, MD   [employer] properties accepted

## 2020-11-17 NOTE — Progress Notes (Signed)
Patient ID: Jesus Hammond, male   DOB: 12-26-1965, 55 y.o.   MRN: [id number]  Spinal catheter pulled at 830 am without complications. Please start xarelto at 1 pm.  Order has been placed.

## 2020-11-17 NOTE — TOC Initial Note (Signed)
Transition of Care [facility]) - Initial/Assessment Note    Patient Details  Name: Jesus Hammond MRN: [id number] Date of Birth: Feb 10, 1966  [facility] CM/SW Contact:    Kingsley Plan, RN Phone Number: 11/17/2020, 11:46 AM  Clinical Narrative:                 Spoke to patient at bedside. DR Roda Shutters office has arranged home health with [facility] ( confirmed with Stacie).   Patient has girl friend to assist him at home.   Ordered walker, 3 in1 and tub bench with Velna Hatchet with [facility]   Expected Discharge Plan: Home w Home Health Services     Patient Goals and CMS Choice   CMS Medicare.gov Compare Post Acute Care list provided to:: Patient Choice offered to / list presented to : Patient  Expected Discharge Plan and Services Expected Discharge Plan: Home w Home Health Services   Discharge Planning Services: CM Consult Post Acute Care Choice: Home Health, Durable Medical Equipment Living arrangements for the past 2 months: Single Family Home                 DME Arranged: 3-N-1, Tub bench, Walker rolling DME Agency: [facility] Date DME Agency Contacted: 11/17/20 Time DME Agency Contacted: 1144 Representative spoke with at DME Agency: Velna Hatchet HH Arranged: PT, OT HH Agency: [facility] Date [facility] Agency Contacted: 11/17/20 Time HH Agency Contacted: 1144 Representative spoke with at [facility] Agency: Stacie  Prior Living Arrangements/Services Living arrangements for the past 2 months: Single Family Home Lives with:: Significant Other Patient language and need for interpreter reviewed:: Yes Do you feel safe going back to the place where you live?: Yes      Need for Family Participation in Patient Care: Yes (Comment) Care giver support system in place?: Yes (comment)   Criminal Activity/Legal Involvement Pertinent to Current Situation/Hospitalization: No - Comment as needed  Activities of Daily Living      Permission Sought/Granted   Permission granted  to share information with : No              Emotional Assessment Appearance:: Appears stated age Attitude/Demeanor/Rapport: Engaged Affect (typically observed): Accepting Orientation: : Oriented to Self, Oriented to Place, Oriented to  Time, Oriented to Situation Alcohol / Substance Use: Not Applicable Psych Involvement: No (comment)  Admission diagnosis:  Status post bilateral total hip replacement [K24.097] Patient Active Problem List   Diagnosis Date Noted   Primary osteoarthritis of right hip 11/16/2020   Primary osteoarthritis of left hip 11/16/2020   Status post bilateral total hip replacement 11/16/2020   PCP:  Judd Lien, PA-C Pharmacy:   [id number] - [place], [place] - [address] [place] [address] Phone: [phone] Fax: [phone]     Social Determinants of Health (SDOH) Interventions    Readmission Risk Interventions No flowsheet data found.

## 2020-11-17 NOTE — Progress Notes (Signed)
Occupational Therapy Evaluation Patient Details Name: Jesus Hammond MRN: [id number] DOB: 02-Sep-1965 Today's Date: 11/17/2020    History of Present Illness Pt adm 7/18 for bil THR direct anterior approach. PMH - HTN, arthritis   Clinical Impression   Aland was evaluated for the above bilat THR. PTA pt was indep in all ADL/IADLs, he lives alone in a 1 level home with 2 STE, his daughter and significant other can provide 24/7 assist at d/c. Upon evaluation pt was min A for bed mobility and close min guard for stand-pivot from bed>chair with RW. Pt is performing lower body ADLs at max A overall for threading over bilat feet in sitting, and hips in standing. Pt tolerated about 2 minutes of standing functional activity prior to feeling nauseated with L hip pain. Pt would benefit from continued OT acutely to progress function in all ADLs and mobility. Recommend d/c to home with HHOT.     Follow Up Recommendations  Home health OT;Supervision - Intermittent    Equipment Recommendations  Other (comment);Tub/shower bench;3 in 1 bedside commode (RA)       Precautions / Restrictions Precautions Precautions: Fall Restrictions Weight Bearing Restrictions: Yes RLE Weight Bearing: Weight bearing as tolerated LLE Weight Bearing: Weight bearing as tolerated      Mobility Bed Mobility Overal bed mobility: Needs Assistance Bed Mobility: Supine to Sit     Supine to sit: Min assist;HOB elevated     General bed mobility comments: assist for LLE    Transfers Overall transfer level: Needs assistance Equipment used: Rolling walker (2 wheeled) Transfers: Sit to/from Stand Sit to Stand: Min guard         General transfer comment: close min guard for sit to stand, vc for hand placement    Balance Overall balance assessment: No apparent balance deficits (not formally assessed)               ADL either performed or assessed with clinical judgement   ADL Overall ADL's : Needs  assistance/impaired Eating/Feeding: Independent;Sitting   Grooming: Set up;Sitting   Upper Body Bathing: Set up;Supervision/ safety;Sitting   Lower Body Bathing: Moderate assistance;Sit to/from stand   Upper Body Dressing : Set up;Sitting   Lower Body Dressing: Maximal assistance;Sit to/from stand   Toilet Transfer: Min guard;Stand-pivot;RW;BSC   Toileting- Architect and Hygiene: Moderate assistance;Sit to/from stand       Functional mobility during ADLs: Min guard (limited to stand pivot from bed>chair due to pain and nausea) General ADL Comments: lower body assist levels for threading clothes and pulling over bilat hips in standing. toilet stand-pivot transfer this session due to pain and nausea. Pt likely to poregress well with room distance ambulation with rw     Vision Baseline Vision/History: Wears glasses Wears Glasses: Reading only Patient Visual Report: No change from baseline Vision Assessment?: No apparent visual deficits            Pertinent Vitals/Pain Pain Assessment: Faces Faces Pain Scale: Hurts even more Pain Location: Lhip>Rhip & back Pain Descriptors / Indicators: Aching;Throbbing Pain Intervention(s): Limited activity within patient's tolerance;Monitored during session     Hand Dominance Right   Extremity/Trunk Assessment Upper Extremity Assessment Upper Extremity Assessment: Overall WFL for tasks assessed   Lower Extremity Assessment Lower Extremity Assessment: Defer to PT evaluation   Cervical / Trunk Assessment Cervical / Trunk Assessment: Normal   Communication Communication Communication: No difficulties   Cognition Arousal/Alertness: Awake/alert Behavior During Therapy: WFL for tasks assessed/performed Overall Cognitive Status: Within Functional Limits  for tasks assessed               General Comments  VSS on RA, epidural pain control removed prior to session, IV intact, pt request short break from ice machines.             Home Living Family/patient expects to be discharged to:: Private residence Living Arrangements: Alone Available Help at Discharge: Family;Available 24 hours/day Type of Home: House Home Access: Stairs to enter [employer] of Steps: 2 Entrance Stairs-Rails: None Home Layout: One level     Bathroom Shower/Tub: Chief Strategy Officer: Standard Bathroom Accessibility: Yes How Accessible: Accessible via walker Home Equipment: None          Prior Functioning/Environment Level of Independence: Independent               OT Treatment/Interventions: Self-care/ADL training;Therapeutic exercise;DME and/or AE instruction;Therapeutic activities;Patient/family education;Balance training    OT Goals(Current goals can be found in the care plan section) Acute Rehab OT Goals Patient Stated Goal: be active again OT Goal Formulation: With patient Time For Goal Achievement: 12/01/20 Potential to Achieve Goals: Good ADL Goals Pt Will Perform Grooming: with modified independence;standing Pt Will Perform Lower Body Bathing: with supervision;with adaptive equipment;sit to/from stand Pt Will Perform Lower Body Dressing: with supervision;with adaptive equipment;sit to/from stand Pt Will Transfer to Toilet: with supervision;ambulating;grab bars Pt Will Perform Toileting - Clothing Manipulation and hygiene: with supervision;sit to/from stand  OT Frequency: Min 2X/week    AM-PAC OT "6 Clicks" Daily Activity     Outcome Measure Help from another person eating meals?: None Help from another person taking care of personal grooming?: A Little Help from another person toileting, which includes using toliet, bedpan, or urinal?: A Lot Help from another person bathing (including washing, rinsing, drying)?: A Lot Help from another person to put on and taking off regular upper body clothing?: A Little Help from another person to put on and taking off regular lower body  clothing?: A Lot 6 Click Score: 16   End of Session Equipment Utilized During Treatment: Gait belt;Rolling walker Nurse Communication: Mobility status;Precautions;Weight bearing status  Activity Tolerance: Patient tolerated treatment well Patient left: in chair;with call bell/phone within reach;with chair alarm set  OT Visit Diagnosis: Other abnormalities of gait and mobility (R26.89);Repeated falls (R29.6);Muscle weakness (generalized) (M62.81);Pain                Time: 9735-3299 OT Time Calculation (min): 48 min Charges:  OT General Charges $OT Visit: 1 Visit OT Evaluation $OT Eval Moderate Complexity: 1 Mod OT Treatments $Therapeutic Activity: 23-37 mins   Kynzi Levay A Eryc Bodey 11/17/2020, 9:34 AM

## 2020-11-17 NOTE — Progress Notes (Signed)
Patient doing well this am.  Reports minimal discomfort to bilateral hips and lower back overnight.  Able to sit on edge of bed and take a few steps without any issues.    Exam: Bilateral hip dressing c/d/I Calves soft and non-tender EHL/FHL intact  Plan: WBAT BLE ABLA- mild and stable Dr. Armond Hang to remove spinal catheter this am Will start xarelto, but will need to wait 4 hours post catheter removal before resuming.   D/c dispo likely tomorrow

## 2020-11-17 NOTE — Progress Notes (Signed)
Pt doing well.  Epidural functioning with effective analgesia.  Epidural was removed fully intact.  Pt tolerated the removal well.  Insertion site was clean and healthy appearing.    Achille Rich MD

## 2020-11-17 NOTE — Progress Notes (Addendum)
Physical Therapy Treatment Patient Details Name: Jesus Hammond MRN: [id number] DOB: 08-Oct-1965 Today's Date: 11/17/2020    History of Present Illness Pt adm 7/18 for bil THR direct anterior approach. PMH - HTN, arthritis    PT Comments    Pt supine in bed on arrival.  Pt continues to improve.  Progressed to stair training and standing exercises.  Ice pack applied to B hips post session.  HEP issued for home use.  Plan for PT in am before d/c home.     Follow Up Recommendations  Follow surgeon's recommendation for DC plan and follow-up therapies     Equipment Recommendations  Rolling walker with 5" wheels;3in1 (PT)    Recommendations for Other Services       Precautions / Restrictions Precautions Precautions: Fall Restrictions Weight Bearing Restrictions: Yes RLE Weight Bearing: Weight bearing as tolerated LLE Weight Bearing: Weight bearing as tolerated    Mobility  Bed Mobility Overal bed mobility: Needs Assistance Bed Mobility: Supine to Sit;Sit to Supine     Supine to sit: Supervision Sit to supine: Min assist   General bed mobility comments: Pt able to perform edge of bed this session without assistance but required min assistance to return back to bed.    Transfers Overall transfer level: Needs assistance Equipment used: Rolling walker (2 wheeled) Transfers: Sit to/from Stand Sit to Stand: Supervision         General transfer comment: Cues for hand placement, foot placement and forward weight shifting.  Ambulation/Gait Ambulation/Gait assistance: Supervision Gait Distance (Feet): 250 Feet Assistive device: Rolling walker (2 wheeled) Gait Pattern/deviations: Step-through pattern;Trunk flexed     General Gait Details: Cues to push RW forward in conjuction with step through pattern to perform and more fluid gt.  Pt tolerated this progression well.   Stairs Stairs: Yes Stairs assistance: Min assist Stair Management: Step to  pattern;Backwards;Forwards;With walker;One rail Right Number of Stairs: 3 (x1 forwards and x2 backwards.) General stair comments: Performed forwards with R rail and patient with increased pain.  Pt required alternate approach to reduce pain performing backward with RW for support.   Wheelchair Mobility    Modified Rankin (Stroke Patients Only)       Balance Overall balance assessment: No apparent balance deficits (not formally assessed)                                          Cognition Arousal/Alertness: Awake/alert Behavior During Therapy: WFL for tasks assessed/performed Overall Cognitive Status: Within Functional Limits for tasks assessed                                        Exercises Total Joint Exercises  Hip ABduction/ADduction: AROM;Both;5 reps;Standing Knee Flexion: AROM;Both;5 reps;Standing Marching in Standing: AROM;Both;5 reps;Standing Standing Hip Extension: AROM;Both;5 reps;Standing    General Comments        Pertinent Vitals/Pain Pain Assessment: Faces Pain Score: 5  Faces Pain Scale: Hurts even more Pain Location: Lhip>Rhip Pain Descriptors / Indicators: Aching;Throbbing Pain Intervention(s): Monitored during session;Repositioned    Home Living                      Prior Function            PT Goals (current goals can now be found in  the care plan section) Acute Rehab PT Goals Patient Stated Goal: be active again Potential to Achieve Goals: Good Progress towards PT goals: Progressing toward goals    Frequency    7X/week      PT Plan Current plan remains appropriate    Co-evaluation              AM-PAC PT "6 Clicks" Mobility   Outcome Measure  Help needed turning from your back to your side while in a flat bed without using bedrails?: A Little Help needed moving from lying on your back to sitting on the side of a flat bed without using bedrails?: A Little Help needed moving to and  from a bed to a chair (including a wheelchair)?: A Little Help needed standing up from a chair using your arms (e.g., wheelchair or bedside chair)?: A Little Help needed to walk in hospital room?: A Little Help needed climbing 3-5 steps with a railing? : A Little 6 Click Score: 18    End of Session Equipment Utilized During Treatment: Gait belt Activity Tolerance: Patient tolerated treatment well Patient left: in bed;with call bell/phone within reach;with bed alarm set Nurse Communication: Mobility status PT Visit Diagnosis: Other abnormalities of gait and mobility (R26.89);Pain Pain - Right/Left:  (BIL) Pain - part of body: Hip     Time: 1706-1730 PT Time Calculation (min) (ACUTE ONLY): 24 min  Charges:  $Gait Training: 8-22 mins $Therapeutic Exercise: 8-22 mins                     Bonney Leitz , PTA Acute Rehabilitation Services Pager [phone] Office [phone]    Dawnita Molner Artis Delay 11/17/2020, 6:38 PM

## 2020-11-17 NOTE — Progress Notes (Signed)
Physical Therapy Treatment Patient Details Name: Jesus Hammond MRN: [id number] DOB: 07-16-1965 Today's Date: 11/17/2020    History of Present Illness Pt adm 7/18 for bil THR direct anterior approach. PMH - HTN, arthritis    PT Comments    Pt seated in recliner.  He is motivated to progression and tolerated therapeutic exercises and gt progression.  Will f/u later this pm for 2nd session.       Follow Up Recommendations  Follow surgeon's recommendation for DC plan and follow-up therapies     Equipment Recommendations  Rolling walker with 5" wheels;3in1 (PT)    Recommendations for Other Services       Precautions / Restrictions Precautions Precautions: Fall Restrictions Weight Bearing Restrictions: Yes RLE Weight Bearing: Weight bearing as tolerated LLE Weight Bearing: Weight bearing as tolerated    Mobility  Bed Mobility Overal bed mobility: Needs Assistance Bed Mobility: Sit to Supine       Sit to supine: Min assist   General bed mobility comments: Min assistance for back to bed.  Performed with gt belt as leg lifters but still required some assistance as this was his first time using the gt belt.    Transfers Overall transfer level: Needs assistance Equipment used: Rolling walker (2 wheeled) Transfers: Sit to/from Stand Sit to Stand: Supervision         General transfer comment: Cues for hand placement, foot placement and forward weight shifting.  Ambulation/Gait Ambulation/Gait assistance: Min guard Gait Distance (Feet): 150 Feet Assistive device: Rolling walker (2 wheeled) Gait Pattern/deviations: Step-through pattern;Step-to pattern;Trunk flexed     General Gait Details: Cues for sequencing.  Started with step to pattern and then progressed to step through sequencing.   Stairs             Wheelchair Mobility    Modified Rankin (Stroke Patients Only)       Balance                                             Cognition Arousal/Alertness: Awake/alert Behavior During Therapy: WFL for tasks assessed/performed Overall Cognitive Status: Within Functional Limits for tasks assessed                                        Exercises Total Joint Exercises Ankle Circles/Pumps: AROM;Both;20 reps;Supine Quad Sets: AROM;Both;10 reps;Supine Heel Slides: AAROM;Both;10 reps;Supine Long Arc Quad: AROM;Both;10 reps;Seated    General Comments        Pertinent Vitals/Pain Pain Assessment: 0-10 Pain Score: 5  Pain Location: Lhip>Rhip & back Pain Descriptors / Indicators: Aching;Throbbing Pain Intervention(s): Monitored during session;Repositioned    Home Living                      Prior Function            PT Goals (current goals can now be found in the care plan section) Acute Rehab PT Goals Patient Stated Goal: be active again Potential to Achieve Goals: Good Progress towards PT goals: Progressing toward goals    Frequency    7X/week      PT Plan Current plan remains appropriate    Co-evaluation              AM-PAC PT "6 Clicks" Mobility  Outcome Measure  Help needed turning from your back to your side while in a flat bed without using bedrails?: A Little Help needed moving from lying on your back to sitting on the side of a flat bed without using bedrails?: A Little Help needed moving to and from a bed to a chair (including a wheelchair)?: A Little Help needed standing up from a chair using your arms (e.g., wheelchair or bedside chair)?: A Little Help needed to walk in hospital room?: A Little Help needed climbing 3-5 steps with a railing? : A Little 6 Click Score: 18    End of Session Equipment Utilized During Treatment: Gait belt Activity Tolerance: Patient tolerated treatment well Patient left: in bed;with call bell/phone within reach;with bed alarm set Nurse Communication: Mobility status PT Visit Diagnosis: Other abnormalities of gait and  mobility (R26.89);Pain Pain - Right/Left:  (BIL) Pain - part of body: Hip     Time: 8768-1157 PT Time Calculation (min) (ACUTE ONLY): 30 min  Charges:  $Gait Training: 8-22 mins $Therapeutic Activity: 8-22 mins                     Bonney Leitz , PTA Acute Rehabilitation Services Pager [phone] Office [phone]    Yudit Modesitt Artis Delay 11/17/2020, 4:17 PM

## 2020-11-18 DIAGNOSIS — M160 Bilateral primary osteoarthritis of hip: Secondary | ICD-10-CM | POA: Diagnosis not present

## 2020-11-18 MED ORDER — SODIUM CHLORIDE 0.9 % IV BOLUS
1000.0000 mL | Freq: Once | INTRAVENOUS | Status: AC
  Administered 2020-11-18: 1000 mL via INTRAVENOUS

## 2020-11-18 NOTE — Progress Notes (Signed)
Occupational Therapy Treatment Patient Details Name: Jesus Hammond MRN: [id number] DOB: Mar 14, 1966 Today's Date: 11/18/2020    History of present illness Pt adm 7/18 for bil THR direct anterior approach. PMH - HTN, arthritis   OT comments  Willmar is progressing towards his goals well. Pt completed dressing ADLs at a min A level overall. Pt was educated on use of AE however pt reports that his family will assist him for now until he is able to do it himself. Pt is very motivated with great safety awareness. He completed all functional mobility at mod I/supervision level with RW given increased time for pain management. Pt would continued to benefit from continued OT acutely. D/c plan remains appropriate.    Follow Up Recommendations  Home health OT;Supervision - Intermittent    Equipment Recommendations  Other (comment);Tub/shower bench;3 in 1 bedside commode (RW)       Precautions / Restrictions Precautions Precautions: Fall Restrictions Weight Bearing Restrictions: Yes RLE Weight Bearing: Weight bearing as tolerated LLE Weight Bearing: Weight bearing as tolerated       Mobility Bed Mobility Overal bed mobility: Modified Independent Bed Mobility: Supine to Sit     Supine to sit: Modified independent (Device/Increase time)     General bed mobility comments: HOB elevated slightly    Transfers Overall transfer level: Needs assistance Equipment used: Rolling walker (2 wheeled) Transfers: Sit to/from Stand Sit to Stand: Modified independent (Device/Increase time)              Balance Overall balance assessment: No apparent balance deficits (not formally assessed)           ADL either performed or assessed with clinical judgement   ADL Overall ADL's : Needs assistance/impaired       Upper Body Dressing : Set up;Sitting   Lower Body Dressing: Minimal assistance;Sit to/from stand Lower Body Dressing Details (indicate cue type and reason): min A to thread  over bilat socks due to fabric sticking on grippy socks - pt likely able to complete task with mod I in normal, or no socks. Toilet Transfer: Min guard;Ambulation;RW Toilet Transfer Details (indicate cue type and reason): simulated walking aruond bed to chair         Functional mobility during ADLs: Min guard;Rolling walker General ADL Comments: reviewed compensatory tehcniques for lower body dressing, discussed AE options, however pt able to thread shorts without AE. Pt reported his family will assist with socks as needed     Vision   Vision Assessment?: No apparent visual deficits          Cognition Arousal/Alertness: Awake/alert Behavior During Therapy: WFL for tasks assessed/performed Overall Cognitive Status: Within Functional Limits for tasks assessed                        General Comments no new concerns, VSS this session - discussed signs and symptoms of low BP and strategies to ensure safety if pt experiences symptoms upon d/c home    Pertinent Vitals/ Pain       Pain Assessment: Faces Faces Pain Scale: Hurts a little bit Pain Location: Lhip>Rhip Pain Descriptors / Indicators: Aching;Throbbing Pain Intervention(s): Monitored during session   Frequency  Min 2X/week        Progress Toward Goals  OT Goals(current goals can now be found in the care plan section)  Progress towards OT goals: Progressing toward goals  Acute Rehab OT Goals Patient Stated Goal: be active again OT Goal Formulation: With patient Time  For Goal Achievement: 12/01/20 Potential to Achieve Goals: Good ADL Goals Pt Will Perform Grooming: with modified independence;standing Pt Will Perform Lower Body Bathing: with supervision;with adaptive equipment;sit to/from stand Pt Will Perform Lower Body Dressing: with supervision;with adaptive equipment;sit to/from stand Pt Will Transfer to Toilet: with supervision;ambulating;grab bars Pt Will Perform Toileting - Clothing Manipulation and  hygiene: with supervision;sit to/from stand  Plan Discharge plan remains appropriate       AM-PAC OT "6 Clicks" Daily Activity     Outcome Measure   Help from another person eating meals?: None Help from another person taking care of personal grooming?: A Little Help from another person toileting, which includes using toliet, bedpan, or urinal?: A Little Help from another person bathing (including washing, rinsing, drying)?: A Little Help from another person to put on and taking off regular upper body clothing?: None Help from another person to put on and taking off regular lower body clothing?: A Little 6 Click Score: 20    End of Session Equipment Utilized During Treatment: Gait belt;Rolling walker  OT Visit Diagnosis: Other abnormalities of gait and mobility (R26.89);Repeated falls (R29.6);Muscle weakness (generalized) (M62.81);Pain   Activity Tolerance Patient tolerated treatment well   Patient Left in chair;with call bell/phone within reach;with chair alarm set   Nurse Communication Mobility status;Precautions;Weight bearing status        Time: 1348-1406 OT Time Calculation (min): 18 min  Charges: OT General Charges $OT Visit: 1 Visit OT Treatments $Self Care/Home Management : 8-22 mins     Philomene Haff A Agapita Savarino 11/18/2020, 2:14 PM

## 2020-11-18 NOTE — Progress Notes (Signed)
Physical Therapy Treatment Patient Details Name: Jesus Hammond MRN: [id number] DOB: 1966/01/22 Today's Date: 11/18/2020    History of Present Illness Pt adm 7/18 for bil THR direct anterior approach. PMH - HTN, arthritis    PT Comments    Pt seated in recliner with feet in dependent position.  Unable to obtain true set of orthostatics.  Pt does continues to drop in Systolic BP with position changes but with rest periods he does start to recover. Pt able to progress to gt training this pm with no c/o dizziness.  Informed RN and MD of BP readings listed below.   BP Readings Sitting- 101/60 Standing - 78/51 Standing after 3 min - 87/86 Sitting post session - 117/75   Follow Up Recommendations  Follow surgeon's recommendation for DC plan and follow-up therapies     Equipment Recommendations  Rolling walker with 5" wheels;3in1 (PT)    Recommendations for Other Services       Precautions / Restrictions Precautions Precautions: Fall Restrictions Weight Bearing Restrictions: Yes RLE Weight Bearing: Weight bearing as tolerated LLE Weight Bearing: Weight bearing as tolerated    Mobility  Bed Mobility Overal bed mobility: Needs Assistance Bed Mobility: Supine to Sit     Supine to sit: Min guard Sit to supine: Mod assist   General bed mobility comments: Pt seated in recliner on arrival.    Transfers Overall transfer level: Needs assistance Equipment used: Rolling walker (2 wheeled) Transfers: Sit to/from Stand Sit to Stand: Supervision         General transfer comment: Cues for hand placement to and from seated surface.  Pt slow and guarded to return to seated position.  Ambulation/Gait Ambulation/Gait assistance: Supervision Gait Distance (Feet): 200 Feet Assistive device: Rolling walker (2 wheeled) Gait Pattern/deviations: Step-through pattern;Trunk flexed;Step-to pattern     General Gait Details: Pt regressed to step to and appears more cautious this pm.   He denies feeling dizzy.  He did progress back to step through as gt continued.   Stairs             Wheelchair Mobility    Modified Rankin (Stroke Patients Only)       Balance Overall balance assessment: No apparent balance deficits (not formally assessed)                                          Cognition Arousal/Alertness: Awake/alert Behavior During Therapy: WFL for tasks assessed/performed Overall Cognitive Status: Within Functional Limits for tasks assessed                                           General Comments General comments (skin integrity, edema, etc.): no new concerns, VSS this session - discussed signs and symptoms of low BP and strategies to ensure safety if pt experiences symptoms upon d/c home      Pertinent Vitals/Pain Pain Assessment: 0-10 Pain Score: 8  Faces Pain Scale: Hurts a little bit Pain Location: Lhip>Rhip Pain Descriptors / Indicators: Aching;Throbbing;Sore Pain Intervention(s): Monitored during session;Repositioned    Home Living                      Prior Function            PT Goals (current goals can now  be found in the care plan section) Acute Rehab PT Goals Patient Stated Goal: be active again Potential to Achieve Goals: Good Progress towards PT goals: Progressing toward goals    Frequency    7X/week      PT Plan Current plan remains appropriate    Co-evaluation              AM-PAC PT "6 Clicks" Mobility   Outcome Measure  Help needed turning from your back to your side while in a flat bed without using bedrails?: A Little Help needed moving from lying on your back to sitting on the side of a flat bed without using bedrails?: A Little Help needed moving to and from a bed to a chair (including a wheelchair)?: A Little Help needed standing up from a chair using your arms (e.g., wheelchair or bedside chair)?: A Little Help needed to walk in hospital room?: A  Little Help needed climbing 3-5 steps with a railing? : A Little 6 Click Score: 18    End of Session Equipment Utilized During Treatment: Gait belt Activity Tolerance: Patient tolerated treatment well Patient left: in bed;with call bell/phone within reach;with bed alarm set Nurse Communication: Mobility status PT Visit Diagnosis: Other abnormalities of gait and mobility (R26.89);Pain Pain - Right/Left:  (BIL) Pain - part of body: Hip     Time: 5374-8270 PT Time Calculation (min) (ACUTE ONLY): 33 min  Charges:  $Gait Training: 8-22 mins $Therapeutic Activity: 8-22 mins                     Bonney Leitz , PTA Acute Rehabilitation Services Pager [phone] Office [phone]    Milan Perkins Artis Delay 11/18/2020, 3:40 PM

## 2020-11-18 NOTE — Discharge Summary (Signed)
Patient ID: Jesus Hammond MRN: [id number] DOB/AGE: 1965/10/19 55 y.o.  Admit date: 11/16/2020 Discharge date: 11/18/2020  Admission Diagnoses:  Principal Problem:   Primary osteoarthritis of left hip Active Problems:   Primary osteoarthritis of right hip   Status post bilateral total hip replacement   Discharge Diagnoses:  Same  Past Medical History:  Diagnosis Date   Arthritis    bilateral hips   Hypertension     Surgeries: Procedure(s): LEFT ANTERIOR TOTAL HIP ARTHROPLASTY RIGHT TOTAL HIP ARTHROPLASTY on 11/16/2020   Consultants:   Discharged Condition: Improved  Hospital Course: Jesus Hammond is an 55 y.o. male who was admitted 11/16/2020 for operative treatment ofPrimary osteoarthritis of left hip. Patient has severe unremitting pain that affects sleep, daily activities, and work/hobbies. After pre-op clearance the patient was taken to the operating room on 11/16/2020 and underwent  Procedure(s): LEFT ANTERIOR TOTAL HIP ARTHROPLASTY RIGHT TOTAL HIP ARTHROPLASTY.    Patient was given perioperative antibiotics:  Anti-infectives (From admission, onward)    Start     Dose/Rate Route Frequency Ordered Stop   11/16/20 1715  ceFAZolin (ANCEF) IVPB 2g/100 mL premix        2 g 200 mL/hr over 30 Minutes Intravenous Every 6 hours 11/16/20 1615 11/16/20 2228   11/16/20 0827  vancomycin (VANCOCIN) powder  Status:  Discontinued          As needed 11/16/20 0828 11/16/20 1129   11/16/20 0615  ceFAZolin (ANCEF) IVPB 2g/100 mL premix        2 g 200 mL/hr over 30 Minutes Intravenous On call to O.R. 11/16/20 0601 11/16/20 0800   11/16/20 0000  sulfamethoxazole-trimethoprim (BACTRIM DS) 800-160 MG tablet        1 tablet Oral 2 times daily 11/16/20 0730          Patient was given sequential compression devices, early ambulation, and chemoprophylaxis to prevent DVT.  Patient benefited maximally from hospital stay and there were no complications.    Recent vital signs: Patient  Vitals for the past 24 hrs:  BP Temp Temp src Pulse Resp SpO2  11/18/20 0748 109/64 98.8 F (37.1 C) Oral 94 17 95 %  11/18/20 0439 (!) 101/58 99 F (37.2 C) Oral 93 18 95 %  11/18/20 0015 117/69 99.3 F (37.4 C) Oral 95 18 96 %  11/17/20 1947 114/76 98.9 F (37.2 C) Oral 91 18 95 %  11/17/20 1523 109/68 98.2 F (36.8 C) Oral 78 19 96 %  11/17/20 1126 (!) 98/59 98.4 F (36.9 C) Oral 79 19 97 %     Recent laboratory studies:  Recent Labs    11/17/20 0045  WBC 12.1*  HGB 11.6*  HCT 35.1*  PLT 229  NA 134*  K 3.7  CL 103  CO2 25  BUN 9  CREATININE 0.78  GLUCOSE 119*  CALCIUM 8.6*     Discharge Medications:   Allergies as of 11/18/2020       Reactions   Penicillins Other (See Comments)   Unknown - episode happened during childhood, thinks possible rash. As an Infant Other reaction(s): Other As an Infant        Medication List     STOP taking these medications    ibuprofen 200 MG tablet Commonly known as: ADVIL       TAKE these medications    aspirin EC 81 MG tablet Take 1 tablet (81 mg total) by mouth 2 (two) times daily. Do not start taking until finished with xarelto prescription  What changed: additional instructions   docusate sodium 100 MG capsule Commonly known as: Colace Take 1 capsule (100 mg total) by mouth daily as needed.   hydrochlorothiazide 25 MG tablet Commonly known as: HYDRODIURIL Take 25 mg by mouth daily.   lisinopril 40 MG tablet Commonly known as: ZESTRIL Take 40 mg by mouth daily.   methocarbamol 500 MG tablet Commonly known as: Robaxin Take 1 tablet (500 mg total) by mouth 2 (two) times daily as needed. To be taken after surgery   ondansetron 4 MG tablet Commonly known as: Zofran Take 1 tablet (4 mg total) by mouth every 8 (eight) hours as needed for nausea or vomiting.   oxyCODONE-acetaminophen 5-325 MG tablet Commonly known as: Percocet Take 1-2 tablets by mouth every 6 (six) hours as needed. To be taken after  surgery   pseudoephedrine-acetaminophen 30-500 MG Tabs tablet Commonly known as: TYLENOL SINUS Take 1 tablet by mouth every 4 (four) hours as needed (sinus problems).   rivaroxaban 10 MG Tabs tablet Commonly known as: XARELTO Take 1 tablet (10 mg total) by mouth daily. Take for the first 30 days after surgery. Once finished, start baby aspirin twice daily x 30 days.   sulfamethoxazole-trimethoprim 800-160 MG tablet Commonly known as: BACTRIM DS Take 1 tablet by mouth 2 (two) times daily.               Durable Medical Equipment  (From admission, onward)           Start     Ordered   11/17/20 1142  For home use only DME Tub bench  Once       Comments: Tub shower bench   11/17/20 1142   11/16/20 1616  DME Walker rolling  Once       Question:  Patient needs a walker to treat with the following condition  Answer:  History of hip replacement   11/16/20 1615   11/16/20 1616  DME 3 n 1  Once        11/16/20 1615   11/16/20 1616  DME Bedside commode  Once       Question:  Patient needs a bedside commode to treat with the following condition  Answer:  History of hip replacement   11/16/20 1615            Diagnostic Studies: DG Chest 2 View  Result Date: 11/13/2020 CLINICAL DATA:  Provided history: Pre-admission for bilateral hip replacement. EXAM: CHEST - 2 VIEW COMPARISON:  No pertinent prior exams available for comparison. FINDINGS: Heart size within normal limits. No appreciable airspace consolidation. No evidence of pleural effusion or pneumothorax. No acute bony abnormality identified. Degenerative changes of the spine. IMPRESSION: No evidence of active cardiopulmonary disease. Electronically Signed   By: Jackey Loge DO   On: 11/13/2020 16:01   DG Pelvis Portable  Result Date: 11/16/2020 CLINICAL DATA:  Status post bilateral hip replacements. EXAM: PORTABLE PELVIS 1-2 VIEWS COMPARISON:  None. FINDINGS: Bilateral total hip arthroplasties are noted. The femoral and  acetabular components appear to be well situated. Expected postoperative changes are seen involving the surrounding soft tissues. IMPRESSION: Status post bilateral total hip arthroplasties. Electronically Signed   By: Lupita Raider M.D.   On: 11/16/2020 12:33   DG C-Arm 1-60 Min  Result Date: 11/16/2020 CLINICAL DATA:  Bilateral hip arthroplasties with anterior approach. Provided FLUOROSCOPY TIME:  0 minutes, 50 seconds (4.45 mGy). EXAM: OPERATIVE bilateral HIP (WITH PELVIS IF PERFORMED) 8 VIEWS TECHNIQUE: Fluoroscopic spot image(s)  were submitted for interpretation post-operatively. COMPARISON:  None FINDINGS: Eight intraoperative fluoroscopic images of the bilateral hips are submitted. On the provided images, there are findings of interval bilateral total hip arthroplasty. The femoral and acetabular components appear well-seated. IMPRESSION: Eight intraoperative fluoroscopic images of the bilateral hips from bilateral total hip arthroplasty. Electronically Signed   By: Jackey Loge DO   On: 11/16/2020 11:22   DG HIP OPERATIVE UNILAT WITH PELVIS LEFT  Result Date: 11/16/2020 CLINICAL DATA:  Bilateral hip arthroplasties with anterior approach. Provided FLUOROSCOPY TIME:  0 minutes, 50 seconds (4.45 mGy). EXAM: OPERATIVE bilateral HIP (WITH PELVIS IF PERFORMED) 8 VIEWS TECHNIQUE: Fluoroscopic spot image(s) were submitted for interpretation post-operatively. COMPARISON:  None FINDINGS: Eight intraoperative fluoroscopic images of the bilateral hips are submitted. On the provided images, there are findings of interval bilateral total hip arthroplasty. The femoral and acetabular components appear well-seated. IMPRESSION: Eight intraoperative fluoroscopic images of the bilateral hips from bilateral total hip arthroplasty. Electronically Signed   By: Jackey Loge DO   On: 11/16/2020 11:22   DG HIP OPERATIVE UNILAT WITH PELVIS RIGHT  Result Date: 11/16/2020 CLINICAL DATA:  Bilateral hip arthroplasties with  anterior approach. Provided FLUOROSCOPY TIME:  0 minutes, 50 seconds (4.45 mGy). EXAM: OPERATIVE bilateral HIP (WITH PELVIS IF PERFORMED) 8 VIEWS TECHNIQUE: Fluoroscopic spot image(s) were submitted for interpretation post-operatively. COMPARISON:  None FINDINGS: Eight intraoperative fluoroscopic images of the bilateral hips are submitted. On the provided images, there are findings of interval bilateral total hip arthroplasty. The femoral and acetabular components appear well-seated. IMPRESSION: Eight intraoperative fluoroscopic images of the bilateral hips from bilateral total hip arthroplasty. Electronically Signed   By: Jackey Loge DO   On: 11/16/2020 11:22    Disposition: Discharge disposition: 01-Home or Self Care          Follow-up Information     Tarry Kos, MD. Schedule an appointment as soon as possible for a visit in 2 week(s).   Specialty: Orthopedic Surgery Contact information: [address] [place] [address] [phone]                  Signed: Cristie Hem 11/18/2020, 7:59 AM

## 2020-11-18 NOTE — TOC Transition Note (Addendum)
Transition of Care [facility]) - CM/SW Discharge Note   Patient Details  Name: Jesus Hammond MRN: [id number] Date of Birth: 1965-06-02  Transition of [facility] CM/SW Contact:  Lawerance Sabal, RN Phone Number: 11/18/2020, 8:04 AM   Clinical Narrative:   Notified [facility] of DC. DME ordered yesterday to room. Texted patient [name] 30 day card, and explained use. He understands to show image to pharmacist when he pick up his medication.  No other CM needs identified    Final next level of care: Home w Home Health Services     Patient Goals and CMS Choice   CMS Medicare.gov Compare Post Acute Care list provided to:: Patient Choice offered to / list presented to : Patient  Discharge Placement                       Discharge Plan and Services   Discharge Planning Services: CM Consult Post Acute Care Choice: Home Health, Durable Medical Equipment          DME Arranged: 3-N-1, Tub bench, Walker rolling DME Agency: [employer] Date DME Agency Contacted: 11/17/20 Time DME Agency Contacted: 1144 Representative spoke with at DME Agency: Velna Hatchet HH Arranged: PT, OT HH Agency: [facility] Date [facility] Agency Contacted: 11/17/20 Time HH Agency Contacted: 1144 Representative spoke with at [facility] Agency: Stacie  Social Determinants of Health (SDOH) Interventions     Readmission Risk Interventions No flowsheet data found.

## 2020-11-18 NOTE — Progress Notes (Signed)
Physical Therapy Treatment Patient Details Name: Jesus Hammond MRN: [id number] DOB: 1965/08/19 Today's Date: 11/18/2020    History of Present Illness Pt adm 7/18 for bil THR direct anterior approach. PMH - HTN, arthritis    PT Comments    Pt supine in bed this session.  Pt reports dizziness after standing edge of bed and returned to seated position.  BP 77/54 and further tx terminated will f/u in pm to progress mobility as able.     Follow Up Recommendations  Follow surgeon's recommendation for DC plan and follow-up therapies     Equipment Recommendations  Rolling walker with 5" wheels;3in1 (PT)    Recommendations for Other Services       Precautions / Restrictions Precautions Precautions: Fall Restrictions Weight Bearing Restrictions: Yes RLE Weight Bearing: Weight bearing as tolerated LLE Weight Bearing: Weight bearing as tolerated    Mobility  Bed Mobility Overal bed mobility: Needs Assistance Bed Mobility: Supine to Sit     Supine to sit: Min guard Sit to supine: Mod assist   General bed mobility comments: Increased assistance to mobilize this session due to soreness.  Pt required increased assistance to return back to bed due to dizziness.    Transfers Overall transfer level: Needs assistance Equipment used: Rolling walker (2 wheeled) Transfers: Sit to/from Stand Sit to Stand: Min guard         General transfer comment: Cues for hand placement to and from seated surface.  Ambulation/Gait Ambulation/Gait assistance:  (deferred as he reports dizziness in standing, returned to sitting and BP 77/54.  Informed MD and RN and returned back to bed.)               Stairs             Wheelchair Mobility    Modified Rankin (Stroke Patients Only)       Balance Overall balance assessment: No apparent balance deficits (not formally assessed)                                          Cognition Arousal/Alertness:  Awake/alert Behavior During Therapy: WFL for tasks assessed/performed Overall Cognitive Status: Within Functional Limits for tasks assessed                                        Exercises Total Joint Exercises Ankle Circles/Pumps: AROM;Both;20 reps;Supine Quad Sets: AROM;Both;10 reps;Supine Heel Slides: AAROM;Both;10 reps;Supine Hip ABduction/ADduction: AROM;Both;5 reps;Standing    General Comments       Pertinent Vitals/Pain Pain Assessment: 0-10 Pain Score: 8  Faces Pain Scale: Hurts a little bit Pain Location: Lhip>Rhip Pain Descriptors / Indicators: Aching;Throbbing Pain Intervention(s): Monitored during session;Repositioned    Home Living                      Prior Function            PT Goals (current goals can now be found in the care plan section) Acute Rehab PT Goals Patient Stated Goal: be active again Potential to Achieve Goals: Good Progress towards PT goals: Progressing toward goals    Frequency    7X/week      PT Plan Current plan remains appropriate    Co-evaluation  AM-PAC PT "6 Clicks" Mobility   Outcome Measure  Help needed turning from your back to your side while in a flat bed without using bedrails?: A Little Help needed moving from lying on your back to sitting on the side of a flat bed without using bedrails?: A Little Help needed moving to and from a bed to a chair (including a wheelchair)?: A Little Help needed standing up from a chair using your arms (e.g., wheelchair or bedside chair)?: A Little Help needed to walk in hospital room?: A Little Help needed climbing 3-5 steps with a railing? : A Little 6 Click Score: 18    End of Session Equipment Utilized During Treatment: Gait belt Activity Tolerance: Patient tolerated treatment well Patient left: in bed;with call bell/phone within reach;with bed alarm set Nurse Communication: Mobility status PT Visit Diagnosis: Other abnormalities  of gait and mobility (R26.89);Pain Pain - Right/Left:  (BIL) Pain - part of body: Hip     Time: 3112-1624 PT Time Calculation (min) (ACUTE ONLY): 27 min  Charges:  $Therapeutic Exercise: 8-22 mins $Therapeutic Activity: 8-22 mins                     Bonney Leitz , PTA Acute Rehabilitation Services Pager [phone] Office [phone]    Arael Piccione Artis Delay 11/18/2020, 2:51 PM

## 2020-11-18 NOTE — Progress Notes (Signed)
Subjective: 2 Days Post-Op Procedure(s) (LRB): LEFT ANTERIOR TOTAL HIP ARTHROPLASTY (Left) RIGHT TOTAL HIP ARTHROPLASTY (Right) Patient reports pain as mild.    Objective: Vital signs in last 24 hours: Temp:  [98.2 F (36.8 C)-99.3 F (37.4 C)] 98.8 F (37.1 C) (07/20 0748) Pulse Rate:  [78-95] 94 (07/20 0748) Resp:  [17-19] 17 (07/20 0748) BP: (98-117)/(58-76) 109/64 (07/20 0748) SpO2:  [95 %-97 %] 95 % (07/20 0748)  Intake/Output from previous day: 07/19 0701 - 07/20 0700 In: 360 [P.O.:360] Out: 1050 [Urine:1050] Intake/Output this shift: No intake/output data recorded.  Recent Labs    11/17/20 0045  HGB 11.6*   Recent Labs    11/17/20 0045  WBC 12.1*  RBC 4.26  HCT 35.1*  PLT 229   Recent Labs    11/17/20 0045  NA 134*  K 3.7  CL 103  CO2 25  BUN 9  CREATININE 0.78  GLUCOSE 119*  CALCIUM 8.6*   No results for input(s): LABPT, INR in the last 72 hours.  Neurologically intact Neurovascular intact Sensation intact distally Intact pulses distally Dorsiflexion/Plantar flexion intact Incision: dressing C/D/I No cellulitis present Compartment soft   Assessment/Plan: 2 Days Post-Op Procedure(s) (LRB): LEFT ANTERIOR TOTAL HIP ARTHROPLASTY (Left) RIGHT TOTAL HIP ARTHROPLASTY (Right) Advance diet Up with therapy D/C IV fluids Discharge home with home health after first or second PT session.  Up to patient and how well he progresses.  WBAT BLE ABLA- mild and stable      Cristie Hem 11/18/2020, 7:58 AM

## 2020-12-01 ENCOUNTER — Other Ambulatory Visit: Payer: Self-pay

## 2020-12-01 ENCOUNTER — Encounter: Payer: Self-pay | Admitting: Orthopaedic Surgery

## 2020-12-01 ENCOUNTER — Ambulatory Visit (INDEPENDENT_AMBULATORY_CARE_PROVIDER_SITE_OTHER): Payer: 59 | Admitting: Physician Assistant

## 2020-12-01 DIAGNOSIS — Z96643 Presence of artificial hip joint, bilateral: Secondary | ICD-10-CM

## 2020-12-01 MED ORDER — METHOCARBAMOL 500 MG PO TABS: 500.0000 mg | ORAL_TABLET | Freq: Two times a day (BID) | ORAL | 0 refills | Status: DC | PRN

## 2020-12-01 MED ORDER — OXYCODONE-ACETAMINOPHEN 5-325 MG PO TABS: 1.0000 | ORAL_TABLET | Freq: Three times a day (TID) | ORAL | 0 refills | Status: DC | PRN

## 2020-12-01 NOTE — Progress Notes (Signed)
   Post-Op Visit Note   Patient: Jesus Hammond           Date of Birth: January 12, 1966           MRN: [id number] Visit Date: 12/01/2020 PCP: Judd Lien, PA-C   Assessment & Plan:  Chief Complaint:  Chief Complaint  Patient presents with   Left Hip - Pain   Right Hip - Pain   Visit Diagnoses:  1. Status post bilateral total hip replacement     Plan: Patient is a pleasant 55 year old gentleman who comes in today 2 weeks status post bilateral total hip replacement 11/16/2020.  He has been doing well.  He is using a cane.  He is getting home health physical therapy and making good progress.  Overall, doing very well.  Examination of both hips reveals well-healing surgical incisions without evidence of infection or cellulitis.  Does have a very small seroma on the right.  Calves are soft nontender.  At this point, Steri-Strips were applied.  He will continue with his Xarelto for another 2 weeks and then transition to baby aspirin twice daily x4 weeks.  We will finish his home health physical therapy and if they think that he needs any outpatient physical therapy will let us know.  Otherwise, follow-up with [place] in 4 weeks time for repeat evaluation and AP pelvis x-rays.  Dental prophylaxis reinforced.  Follow-Up Instructions: Return in about 4 weeks (around 12/29/2020).   Orders:  No orders of the defined types were placed in this encounter.  No orders of the defined types were placed in this encounter.   Imaging: No new imaging  PMFS History: Patient Active Problem List   Diagnosis Date Noted   Primary osteoarthritis of right hip 11/16/2020   Primary osteoarthritis of left hip 11/16/2020   Status post bilateral total hip replacement 11/16/2020   Past Medical History:  Diagnosis Date   Arthritis    bilateral hips   Hypertension     History reviewed. No pertinent family history.  Past Surgical History:  Procedure Laterality Date   BILATERAL ANTERIOR TOTAL HIP ARTHROPLASTY  Left 11/16/2020   Procedure: LEFT ANTERIOR TOTAL HIP ARTHROPLASTY;  Surgeon: Tarry Kos, MD;  Location: [facility] OR;  Service: Orthopedics;  Laterality: Left;  3-C   HERNIA REPAIR Left    inguinal hernia   TOTAL HIP ARTHROPLASTY Right 11/16/2020   Procedure: RIGHT TOTAL HIP ARTHROPLASTY;  Surgeon: Tarry Kos, MD;  Location: [facility] OR;  Service: Orthopedics;  Laterality: Right;   Social History   Occupational History   Not on file  Tobacco Use   Smoking status: Never   Smokeless tobacco: Never  Vaping Use   Vaping Use: Never used  Substance and Sexual Activity   Alcohol use: Yes    Comment: 2 a week   Drug use: Never   Sexual activity: Not on file

## 2020-12-05 ENCOUNTER — Other Ambulatory Visit: Payer: Self-pay | Admitting: Physician Assistant

## 2020-12-23 ENCOUNTER — Encounter: Payer: Self-pay | Admitting: Orthopaedic Surgery

## 2021-01-05 ENCOUNTER — Other Ambulatory Visit: Payer: Self-pay

## 2021-01-05 ENCOUNTER — Ambulatory Visit (INDEPENDENT_AMBULATORY_CARE_PROVIDER_SITE_OTHER): Payer: 59 | Admitting: Orthopaedic Surgery

## 2021-01-05 ENCOUNTER — Ambulatory Visit (INDEPENDENT_AMBULATORY_CARE_PROVIDER_SITE_OTHER): Payer: 59

## 2021-01-05 DIAGNOSIS — Z96643 Presence of artificial hip joint, bilateral: Secondary | ICD-10-CM | POA: Diagnosis not present

## 2021-01-05 NOTE — Progress Notes (Signed)
   Post-Op Visit Note   Patient: Jesus Hammond           Date of Birth: 1965-09-21           MRN: [id number] Visit Date: 01/05/2021 PCP: Judd Lien, PA-C   Assessment & Plan:  Chief Complaint:  Chief Complaint  Patient presents with   Left Hip - Follow-up    11/16/2020 Bilateral THA   Right Hip - Follow-up    11/16/2020 Bilateral THA   Visit Diagnoses:  1. Status post bilateral total hip replacement     Plan: Jeremi is 6 weeks status post bilateral total hip replacements.  Overall doing well.  No real complaints.  He is making steady improvements.  Notices some discomfort after prolonged standing or walking.  He is using steps well.  Bilateral hip incisions are fully healed.  No signs of infection.  X-rays show stable total hip replacements without complications.  Ambulating without any assistive devices.  From my standpoint Aasir is a recovering well.  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  Return to work note was provided for 01/25/2021 with restrictions.  Dental prophylaxis reinforced.  Recheck in 6 weeks.  Follow-Up Instructions: Return in about 6 weeks (around 02/16/2021).   Orders:  Orders Placed This Encounter  Procedures   XR Pelvis 1-2 Views   No orders of the defined types were placed in this encounter.   Imaging: XR Pelvis 1-2 Views  Result Date: 01/05/2021 Stable total hip replacement without complications   PMFS History: Patient Active Problem List   Diagnosis Date Noted   Primary osteoarthritis of right hip 11/16/2020   Primary osteoarthritis of left hip 11/16/2020   Status post bilateral total hip replacement 11/16/2020   Past Medical History:  Diagnosis Date   Arthritis    bilateral hips   Hypertension     No family history on file.  Past Surgical History:  Procedure Laterality Date   BILATERAL ANTERIOR TOTAL HIP ARTHROPLASTY Left 11/16/2020   Procedure: LEFT ANTERIOR TOTAL HIP ARTHROPLASTY;  Surgeon:  Tarry Kos, MD;  Location: [facility] OR;  Service: Orthopedics;  Laterality: Left;  3-C   HERNIA REPAIR Left    inguinal hernia   TOTAL HIP ARTHROPLASTY Right 11/16/2020   Procedure: RIGHT TOTAL HIP ARTHROPLASTY;  Surgeon: Tarry Kos, MD;  Location: [facility] OR;  Service: Orthopedics;  Laterality: Right;   Social History   Occupational History   Not on file  Tobacco Use   Smoking status: Never   Smokeless tobacco: Never  Vaping Use   Vaping Use: Never used  Substance and Sexual Activity   Alcohol use: Yes    Comment: 2 a week   Drug use: Never   Sexual activity: Not on file

## 2021-02-16 ENCOUNTER — Other Ambulatory Visit: Payer: Self-pay

## 2021-02-16 ENCOUNTER — Encounter: Payer: Self-pay | Admitting: Orthopaedic Surgery

## 2021-02-16 ENCOUNTER — Ambulatory Visit (INDEPENDENT_AMBULATORY_CARE_PROVIDER_SITE_OTHER): Payer: 59 | Admitting: Physician Assistant

## 2021-02-16 DIAGNOSIS — Z96643 Presence of artificial hip joint, bilateral: Secondary | ICD-10-CM

## 2021-02-16 MED ORDER — METHOCARBAMOL 500 MG PO TABS: 500.0000 mg | ORAL_TABLET | Freq: Two times a day (BID) | ORAL | 0 refills | Status: DC | PRN

## 2021-02-16 NOTE — Progress Notes (Signed)
   Post-Op Visit Note   Patient: Jesus Hammond           Date of Birth: 1965-09-21           MRN: [id number] Visit Date: 02/16/2021 PCP: Judd Lien, PA-C   Assessment & Plan:  Chief Complaint:  Chief Complaint  Patient presents with   Right Hip - Follow-up    Bilateral hip arthroplasty 11/16/2020   Left Hip - Follow-up   Visit Diagnoses:  1. Status post bilateral total hip replacement     Plan: Patient is a pleasant 55 year old gentleman who comes in today 3 months out bilateral anterior total hip replacements, date of surgery 11/16/2020.  He has been doing great.  He notes that he has some issues with endurance towards the end of the day but overall has improved.  Examination of both hips reveals full hip flexion.  Painless logroll.  Is neurovascular intact distally.  At this point, he will continue to advance with activity as tolerated.  Follow-up with [place] in 9 months for repeat evaluation AP pelvis x-rays.  Dental prophylaxis reinforced.  Call with concerns or questions.  Follow-Up Instructions: Return in about 9 months (around 11/16/2021).   Orders:  No orders of the defined types were placed in this encounter.  Meds ordered this encounter  Medications   methocarbamol (ROBAXIN) 500 MG tablet    Sig: Take 1 tablet (500 mg total) by mouth 2 (two) times daily as needed.    Dispense:  30 tablet    Refill:  0     Imaging: No new imaging  PMFS History: Patient Active Problem List   Diagnosis Date Noted   Primary osteoarthritis of right hip 11/16/2020   Primary osteoarthritis of left hip 11/16/2020   Status post bilateral total hip replacement 11/16/2020   Past Medical History:  Diagnosis Date   Arthritis    bilateral hips   Hypertension     No family history on file.  Past Surgical History:  Procedure Laterality Date   BILATERAL ANTERIOR TOTAL HIP ARTHROPLASTY Left 11/16/2020   Procedure: LEFT ANTERIOR TOTAL HIP ARTHROPLASTY;  Surgeon: Tarry Kos, MD;   Location: [facility] OR;  Service: Orthopedics;  Laterality: Left;  3-C   HERNIA REPAIR Left    inguinal hernia   TOTAL HIP ARTHROPLASTY Right 11/16/2020   Procedure: RIGHT TOTAL HIP ARTHROPLASTY;  Surgeon: Tarry Kos, MD;  Location: [facility] OR;  Service: Orthopedics;  Laterality: Right;   Social History   Occupational History   Not on file  Tobacco Use   Smoking status: Never   Smokeless tobacco: Never  Vaping Use   Vaping Use: Never used  Substance and Sexual Activity   Alcohol use: Yes    Comment: 2 a week   Drug use: Never   Sexual activity: Not on file

## 2021-11-16 ENCOUNTER — Ambulatory Visit: Payer: 59 | Admitting: Orthopaedic Surgery

## 2021-12-01 ENCOUNTER — Ambulatory Visit (INDEPENDENT_AMBULATORY_CARE_PROVIDER_SITE_OTHER): Payer: 59

## 2021-12-01 ENCOUNTER — Encounter: Payer: Self-pay | Admitting: Orthopaedic Surgery

## 2021-12-01 ENCOUNTER — Ambulatory Visit (INDEPENDENT_AMBULATORY_CARE_PROVIDER_SITE_OTHER): Payer: 59 | Admitting: Orthopaedic Surgery

## 2021-12-01 DIAGNOSIS — Z96643 Presence of artificial hip joint, bilateral: Secondary | ICD-10-CM | POA: Diagnosis not present

## 2021-12-01 NOTE — Progress Notes (Signed)
   Post-Op Visit Note   Patient: Jesus Hammond           Date of Birth: 1965/05/10           MRN: [id number] Visit Date: 12/01/2021 PCP: Judd Lien, PA-C   Assessment & Plan:  Chief Complaint:  Chief Complaint  Patient presents with   Right Hip - Follow-up   Left Hip - Follow-up   Visit Diagnoses:  1. Status post bilateral total hip replacement     Plan: Patient is a pleasant 56 year old gentleman who comes in today 1 year status post bilateral total hip replacement 11/16/2020.  He has been doing well.  No complaints.  Examination of both hips reveals full hip flexion and painless logroll.  He is neurovascular intact distally.  At this point, he will continue to advance with activity as tolerated.  Dental prophylaxis reinforced.  Follow-up in 1 year for repeat evaluation and AP pelvis x-rays.  Call with concerns or questions.  Follow-Up Instructions: Return in about 1 month (around 01/01/2022).   Orders:  Orders Placed This Encounter  Procedures   XR Pelvis 1-2 Views   No orders of the defined types were placed in this encounter.   Imaging: XR Pelvis 1-2 Views  Result Date: 12/01/2021 Stable total hip replacement without complications.  HO formation in the abductors.   PMFS History: Patient Active Problem List   Diagnosis Date Noted   Primary osteoarthritis of right hip 11/16/2020   Primary osteoarthritis of left hip 11/16/2020   Status post bilateral total hip replacement 11/16/2020   Past Medical History:  Diagnosis Date   Arthritis    bilateral hips   Hypertension     No family history on file.  Past Surgical History:  Procedure Laterality Date   BILATERAL ANTERIOR TOTAL HIP ARTHROPLASTY Left 11/16/2020   Procedure: LEFT ANTERIOR TOTAL HIP ARTHROPLASTY;  Surgeon: Tarry Kos, MD;  Location: [facility] OR;  Service: Orthopedics;  Laterality: Left;  3-C   HERNIA REPAIR Left    inguinal hernia   TOTAL HIP ARTHROPLASTY Right 11/16/2020   Procedure: RIGHT TOTAL HIP  ARTHROPLASTY;  Surgeon: Tarry Kos, MD;  Location: [facility] OR;  Service: Orthopedics;  Laterality: Right;   Social History   Occupational History   Not on file  Tobacco Use   Smoking status: Never   Smokeless tobacco: Never  Vaping Use   Vaping Use: Never used  Substance and Sexual Activity   Alcohol use: Yes    Comment: 2 a week   Drug use: Never   Sexual activity: Not on file

## 2022-09-30 ENCOUNTER — Ambulatory Visit (INDEPENDENT_AMBULATORY_CARE_PROVIDER_SITE_OTHER): Payer: 59

## 2022-09-30 ENCOUNTER — Ambulatory Visit
Admission: EM | Admit: 2022-09-30 | Discharge: 2022-09-30 | Disposition: A | Discharge status: Home / Self Care | Payer: 59 | Attending: Family Medicine | Admitting: Family Medicine

## 2022-09-30 DIAGNOSIS — M25532 Pain in left wrist: Secondary | ICD-10-CM

## 2022-09-30 DIAGNOSIS — M79645 Pain in left finger(s): Secondary | ICD-10-CM | POA: Diagnosis not present

## 2022-09-30 MED ORDER — CELECOXIB 100 MG PO CAPS: 100.0000 mg | ORAL_CAPSULE | Freq: Two times a day (BID) | ORAL | 0 refills | Status: AC

## 2022-09-30 NOTE — Discharge Instructions (Addendum)
Advised patient of left wrist/left thumb x-ray with hardcopy and images provided.  Advised patient to take medication as directed with food to completion.  Encouraged increase daily water intake to 64 ounces per day while taking this medication.  Advised if symptoms worsen and/or unresolved please see [facility] orthopedic provider for further evaluation.  Contact information has been provided with this AVS.

## 2022-09-30 NOTE — ED Provider Notes (Signed)
[facility]    CSN: [id number] Arrival date & time: 09/30/22  1439      History   Chief Complaint Chief Complaint  Patient presents with   Wrist Pain    HPI Jesus Hammond is a 57 y.o. male.   HPI 57 year old male presents with left wrist and left thumb pain for 3 weeks.  Patient is unsure if possible injury.  Patient is accompanied by his wife this afternoon.  Reports using OTC Tylenol, Motrin and ice with compression with minimal improvement.  PMH significant for arthritis of bilateral hips and HTN.  Past Medical History:  Diagnosis Date   Arthritis    bilateral hips   Hypertension     Patient Active Problem List   Diagnosis Date Noted   Primary osteoarthritis of right hip 11/16/2020   Primary osteoarthritis of left hip 11/16/2020   Status post bilateral total hip replacement 11/16/2020    Past Surgical History:  Procedure Laterality Date   BILATERAL ANTERIOR TOTAL HIP ARTHROPLASTY Left 11/16/2020   Procedure: LEFT ANTERIOR TOTAL HIP ARTHROPLASTY;  Surgeon: Tarry Kos, MD;  Location: [facility];  Service: Orthopedics;  Laterality: Left;  3-C   HERNIA REPAIR Left    inguinal hernia   TOTAL HIP ARTHROPLASTY Right 11/16/2020   Procedure: RIGHT TOTAL HIP ARTHROPLASTY;  Surgeon: Tarry Kos, MD;  Location: [facility];  Service: Orthopedics;  Laterality: Right;       Home Medications    Prior to Admission medications   Medication Sig Start Date End Date Taking? Authorizing Provider  celecoxib (CELEBREX) 100 MG capsule Take 1 capsule (100 mg total) by mouth 2 (two) times daily for 15 days. 09/30/22 10/15/22 Yes Trevor Iha, FNP  aspirin EC 81 MG tablet Take 1 tablet (81 mg total) by mouth 2 (two) times daily. Do not start taking until finished with xarelto prescription 11/17/20   Cristie Hem, PA-C    Family History History reviewed. No pertinent family history.  Social History Social History   Tobacco Use   Smoking status: Never   Smokeless  tobacco: Never  Vaping Use   Vaping Use: Never used  Substance Use Topics   Alcohol use: Yes    Comment: 2 a week   Drug use: Never     Allergies   Penicillins   Review of Systems Review of Systems  Musculoskeletal:        Left wrist/left thumb pain x 3 weeks.  Reports using Tylenol, Motrin and ice and compression with minimal improvement.     Physical Exam Triage Vital Signs ED Triage Vitals  Enc Vitals Group     BP 09/30/22 1505 (!) 158/94     Pulse Rate 09/30/22 1505 65     Resp 09/30/22 1505 16     Temp 09/30/22 1505 97.8 F (36.6 C)     Temp src --      SpO2 09/30/22 1505 98 %     Weight --      Height --      Head Circumference --      Peak Flow --      Pain Score 09/30/22 1504 7     Pain Loc --      Pain Edu? --      Excl. in GC? --    No data found.  Updated Vital Signs BP (!) 158/94   Pulse 65   Temp 97.8 F (36.6 C)   Resp 16   SpO2 98%  Physical Exam Vitals and nursing note reviewed.  Constitutional:      Appearance: Normal appearance. He is normal weight.  HENT:     Head: Normocephalic and atraumatic.     Mouth/Throat:     Mouth: Mucous membranes are moist.     Pharynx: Oropharynx is clear.  Eyes:     Extraocular Movements: Extraocular movements intact.     Conjunctiva/sclera: Conjunctivae normal.     Pupils: Pupils are equal, round, and reactive to light.  Cardiovascular:     Rate and Rhythm: Normal rate and regular rhythm.     Pulses: Normal pulses.     Heart sounds: Normal heart sounds.  Pulmonary:     Effort: Pulmonary effort is normal.     Breath sounds: Normal breath sounds. No wheezing, rhonchi or rales.  Musculoskeletal:        General: Normal range of motion.     Cervical back: Normal range of motion and neck supple.     Comments: Left wrist (dorsum over distal ulna): Mildly TTP with mild soft tissue swelling noted  Left thumb: Reports mild irritation with flexion/extension, no deformity noted  Skin:    General:  Skin is warm and dry.  Neurological:     General: No focal deficit present.     Mental Status: He is alert and oriented to person, place, and time. Mental status is at baseline.  Psychiatric:        Mood and Affect: Mood normal.        Behavior: Behavior normal.        Thought Content: Thought content normal.      UC Treatments / Results  Labs (all labs ordered are listed, but only abnormal results are displayed) Labs Reviewed - No data to display  EKG   Radiology DG Wrist Complete Left  Result Date: 09/30/2022 CLINICAL DATA:  Left wrist and thumb pain.  No known injury. EXAM: LEFT WRIST - COMPLETE 3+ VIEW COMPARISON:  None Available. FINDINGS: Neutral ulnar variance. Mild to moderate thumb carpometacarpal joint space narrowing, subchondral sclerosis, and peripheral osteophytosis. Minimal degenerative spurring at the lateral aspect of the thumb interphalangeal joint. No acute fracture or dislocation. 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 Electronically Signed   By: Neita Garnet M.D.   On: 09/30/2022 15:37    Procedures Procedures (including critical care time)  Medications Ordered in UC Medications - No data to display  Initial Impression / Assessment and Plan / UC Course  I have reviewed the triage vital signs and the nursing notes.  Pertinent labs & imaging results that were available during my care of the patient were reviewed by me and considered in my medical decision making (see chart for details).     MDM: Thumb pain, left-x-ray revealed above, moderate, carpometacarpal osteoarthritis noted, Rx'd Celebrex 100 mg capsule twice daily x 15 days, advised [facility] orthopedic follow-up if symptoms worsen and/or unresolved; 2.  Left wrist pain-left wrist x-ray revealed above same as 1 Rx'd Celebrex 100 mg capsule twice daily x 15 days recommended [facility] orthopedic follow-up if symptoms worsen and/or unresolved.  Provider contact information  provided on AVS this evening. Advised patient of left wrist/left thumb x-ray with hardcopy and images provided.  Advised patient to take medication as directed with food to completion.  Encouraged increase daily water intake to 64 ounces per day while taking this medication.  Advised if symptoms worsen and/or unresolved please see [facility] orthopedic provider for further evaluation.  Contact information has been provided with this AVS. patient discharged home, hemodynamically stable.  Final Clinical Impressions(s) / UC Diagnoses   Final diagnoses:  Left wrist pain  Thumb pain, left     Discharge Instructions      Advised patient of left wrist/left thumb x-ray with hardcopy and images provided.  Advised patient to take medication as directed with food to completion.  Encouraged increase daily water intake to 64 ounces per day while taking this medication.  Advised if symptoms worsen and/or unresolved please see [facility] orthopedic provider for further evaluation.  Contact information has been provided with this AVS.     ED Prescriptions     Medication Sig Dispense Auth. Provider   celecoxib (CELEBREX) 100 MG capsule Take 1 capsule (100 mg total) by mouth 2 (two) times daily for 15 days. 30 capsule Trevor Iha, FNP      I have reviewed the PDMP during this encounter.   Trevor Iha, FNP 09/30/22 1712

## 2022-09-30 NOTE — ED Triage Notes (Signed)
Pt presents to [facility] with co of left thumb and wrist pain for 3 weeks. Pt is unsure of injury. Pt has been using tylenol, motrin, ice and compression with minimal improvement.

## 2022-10-14 ENCOUNTER — Ambulatory Visit (INDEPENDENT_AMBULATORY_CARE_PROVIDER_SITE_OTHER): Payer: 59 | Admitting: Family Medicine

## 2022-10-14 ENCOUNTER — Encounter: Payer: Self-pay | Admitting: Family Medicine

## 2022-10-14 VITALS — BP 138/90 | Ht 69.0 in | Wt 215.0 lb

## 2022-10-14 DIAGNOSIS — R030 Elevated blood-pressure reading, without diagnosis of hypertension: Secondary | ICD-10-CM | POA: Diagnosis not present

## 2022-10-14 DIAGNOSIS — M654 Radial styloid tenosynovitis [de Quervain]: Secondary | ICD-10-CM

## 2022-10-14 MED ORDER — METHYLPREDNISOLONE ACETATE 40 MG/ML IJ SUSP
40.0000 mg | Freq: Once | INTRAMUSCULAR | Status: AC
  Administered 2022-10-14: 40 mg via INTRA_ARTICULAR

## 2022-10-14 NOTE — Progress Notes (Unsigned)
PCP: Judd Lien, PA-C  Subjective:   HPI: Patient is a 57 y.o. male here for follow-up of left thumb/wrist pain.  Patient has had pain over the dorsal part of his left thumb for about the past month.  The pain has progressed and now radiates over his dorsal forearm up to about the mid forearm as well.  He went to the urgent care on 5/31 and plain films there showed thumb CMC OA as well as some first digit MCP OA as well.  He was given Celebrex 100 twice daily for 15 days and advised to follow-up with orthopedics or sports med if his symptoms did not improve.  Today, he feels like the Celebrex has not helped his thumb pain at all.  He feels like it is progressed and is now fairly severe.  It is limiting his ability to work with the left hand and also is now radiating further up his dorsal forearm compared to when he went to the urgent care.  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  He also plays a recent game on his phone and uses both hands for this.  He states that he has tried a sleeve over his left wrist after the onset of this pain but it did not completely immobilize his thumb.  He has not tried any exercises for the thumb.  He is still working.  Past Medical History:  Diagnosis Date   Arthritis    bilateral hips   Hypertension     Current Outpatient Medications on File Prior to Visit  Medication Sig Dispense Refill   aspirin EC 81 MG tablet Take 1 tablet (81 mg total) by mouth 2 (two) times daily. Do not start taking until finished with xarelto prescription 84 tablet 0   celecoxib (CELEBREX) 100 MG capsule Take 1 capsule (100 mg total) by mouth 2 (two) times daily for 15 days. 30 capsule 0   No current facility-administered medications on file prior to visit.    Past Surgical History:  Procedure Laterality Date   BILATERAL ANTERIOR TOTAL HIP ARTHROPLASTY Left 11/16/2020   Procedure: LEFT ANTERIOR TOTAL HIP  ARTHROPLASTY;  Surgeon: Tarry Kos, MD;  Location: [facility];  Service: Orthopedics;  Laterality: Left;  3-C   HERNIA REPAIR Left    inguinal hernia   TOTAL HIP ARTHROPLASTY Right 11/16/2020   Procedure: RIGHT TOTAL HIP ARTHROPLASTY;  Surgeon: Tarry Kos, MD;  Location: [facility];  Service: Orthopedics;  Laterality: Right;    Allergies  Allergen Reactions   Penicillins Other (See Comments)    Unknown - episode happened during childhood, thinks possible rash. As an Infant Other reaction(s): Other As an Infant     BP (!) 150/90   Ht 5\' 9"  (1.753 m)   Wt 215 lb (97.5 kg)   BMI 31.75 kg/m       Objective:  Physical Exam:  Gen: NAD, comfortable in exam room  Left forearm, wrist, thumb: Mild edema surrounding left wrist.  No erythema or warmth.  Full range of motion of wrist and thumb.  5/5 strength thumb flexion, opposition, extension.  5/5 strength wrist flexion and extension, forearm supination and pronation, elbow flexion and extension.  Reproducible pain with wrist extension against resistance and forearm supination against resistance.  Positive Finkelstein's.  Negative Tinel's and Phalen's.  No pain with ECRB testing.   Procedure: Extensor pollicis tendon steroid injection: Patient was  given informed consent, signed copy in the chart. Appropriate time out was taken. Area prepped in usual sterile fashion. Ethyl chloride was  used for local anesthesia. A 21 gauge 1 1/2 inch needle was used..  1 cc of methylprednisolone 40 mg/ml plus 1 cc of 1% lidocaine without epinephrine was injected into the extensor pollicis tendon. The patient tolerated the procedure well. There were no complications. Post procedure instructions were given.  Assessment & Plan:  1.  Left De Quervain's tenosynovitis: Patient presents with subacute pain of the left dorsal thumb and forearm. Was seen at urgent care and dx with OA of 1st CMC and MCP, but on base on history and exam today his presentation is more  consistent with Berline Lopes.  Discussed treatment options and he wanted to go ahead with injection of the extensor pollicis tendon today due to his severe pain.  Will also manage with brace and exercises. - Steroid injection performed today - Will immobilize thumb with splint - De Quervain's exercises - Follow-up in 4 weeks and reassess.  Would use splint while at work.  Try to limit thumb movement.

## 2022-10-15 DIAGNOSIS — M654 Radial styloid tenosynovitis [de Quervain]: Secondary | ICD-10-CM | POA: Insufficient documentation

## 2022-10-15 DIAGNOSIS — R030 Elevated blood-pressure reading, without diagnosis of hypertension: Secondary | ICD-10-CM | POA: Insufficient documentation

## 2022-10-15 NOTE — Progress Notes (Signed)
[facility] Attending Note: I have seen and examined this patient. I have discussed this patient with the resident and reviewed the assessment and plan as documented above. I agree with the resident's findings and plan. Subacute de Quervain's tenosynovitis LEFT thumb. Lengthy discussion about options. Will definitely need some immobilization and HEP. Difficult as he has very physical job and they are short staffed. He ultimately chose [facility] as well. 2. Elevated BP: review of chart shows multiple episodes. His wife who is with him reports he does not like to go to doctors or take medicines. He might benefit from ambulatory BP monitor to further assess his true pressures. It would also likely be helpful for him to understand the issues better and perhaps accept treatment. He is urged to follow up with PCP. Wife asks about joining my practice for general care and will let me know if he watns to pursue that path.

## 2022-11-07 IMAGING — RF DG HIP (WITH PELVIS) OPERATIVE*R*
1 series · 4 of 4 positions shown · non-contrast
Comparison: None

CLINICAL DATA: Bilateral hip arthroplasties with anterior approach.
Provided

FLUOROSCOPY TIME:  0 minutes, 50 seconds (4.45 mGy).
EXAM:
OPERATIVE bilateral HIP (WITH PELVIS IF PERFORMED) 8 VIEWS
TECHNIQUE: Fluoroscopic spot image(s) were submitted for interpretation
post-operatively.

[Series 1: unknown protocol · 0.20mm/px · 4 of 4 slices shown]
[im 1/4]
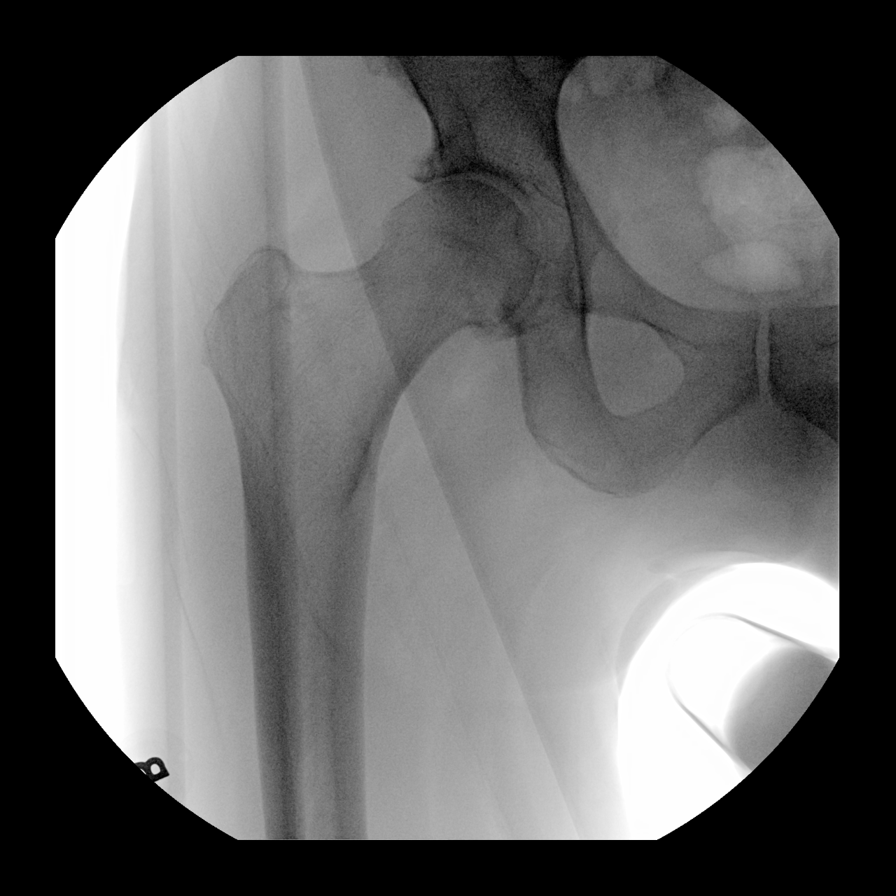
[im 2/4]
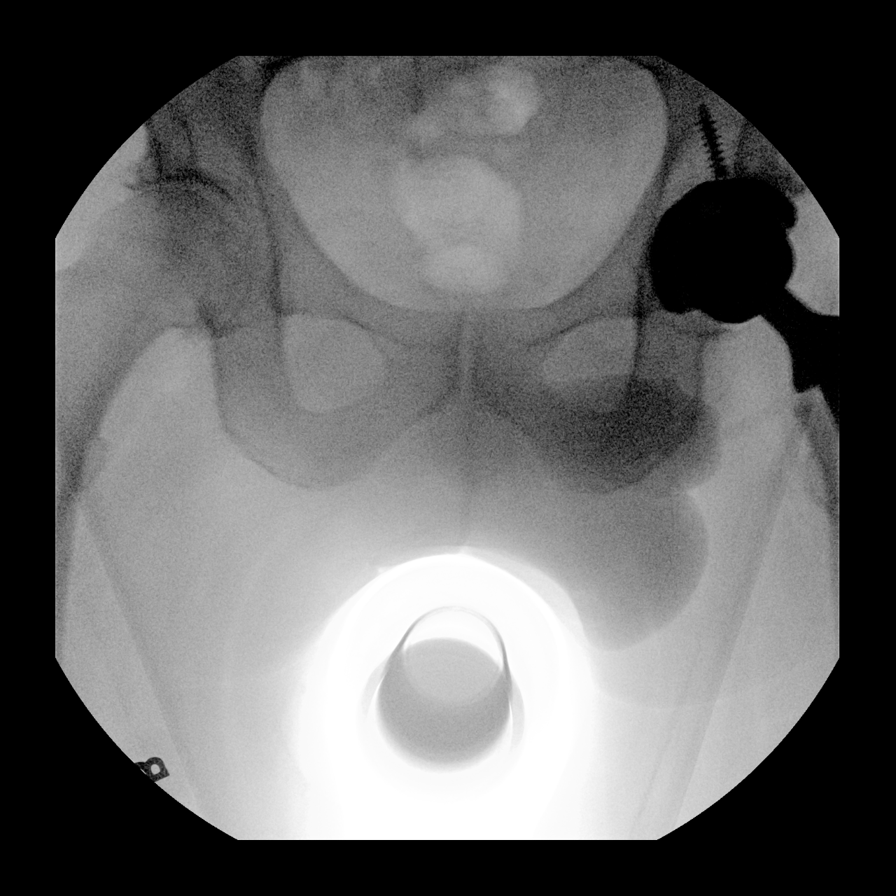
[im 3/4]
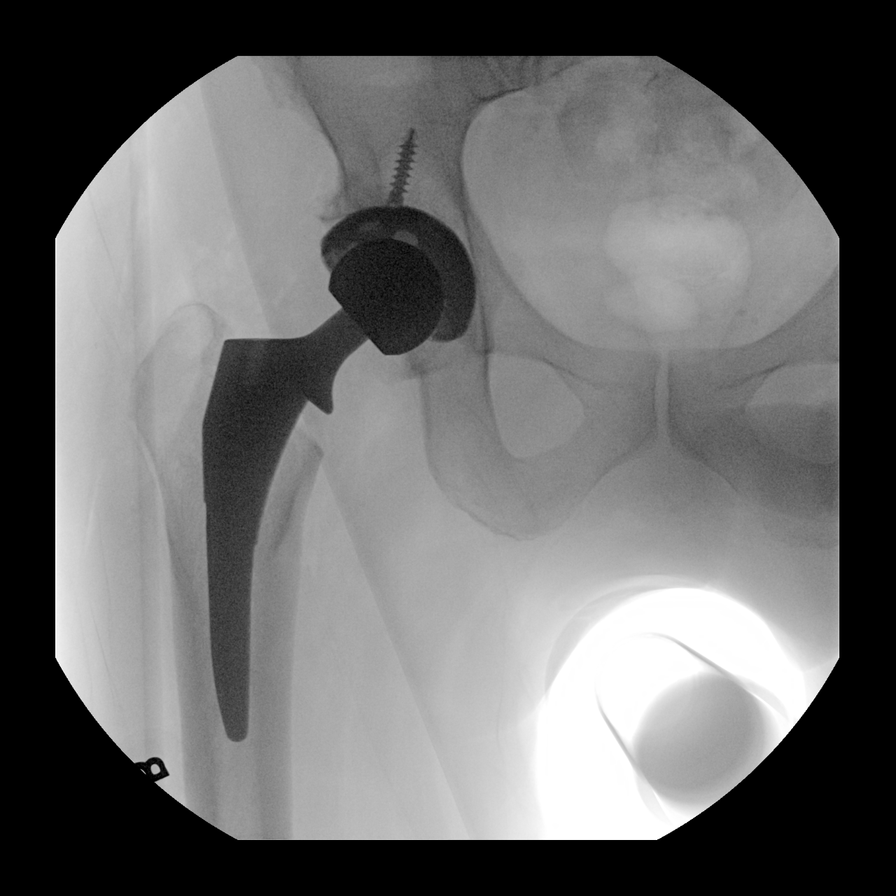
[im 4/4]
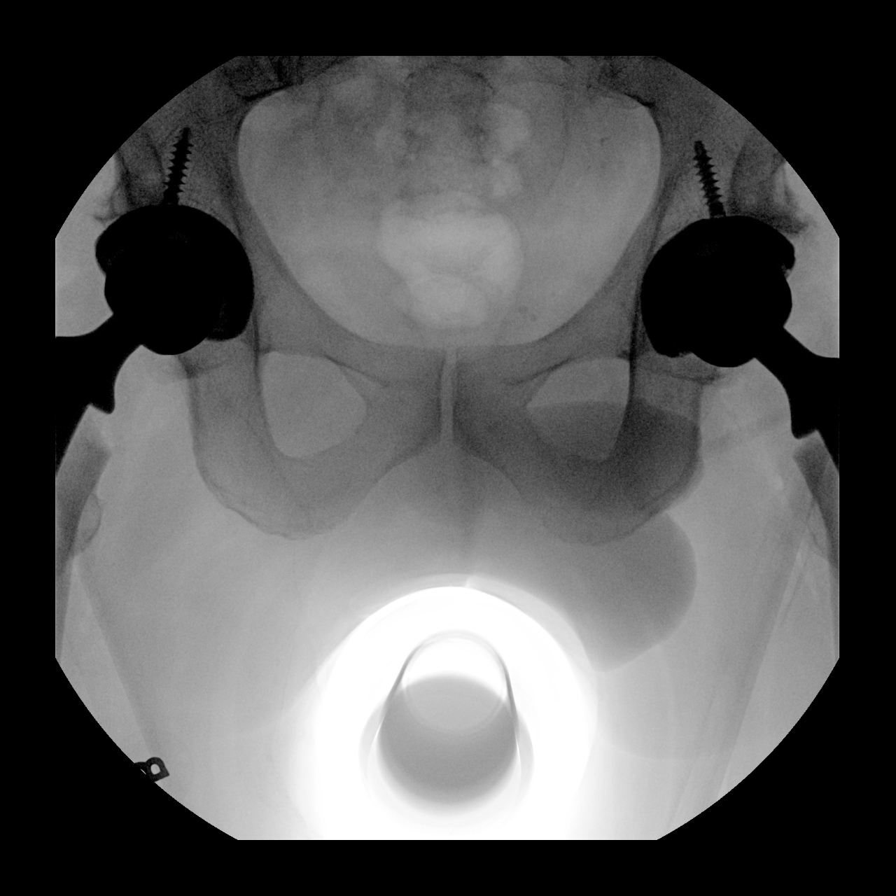

[4 of 4 positions shown; findings below may reference images not displayed]

FINDINGS: Eight intraoperative fluoroscopic images of the bilateral hips are
submitted. On the provided images, there are findings of interval
bilateral total hip arthroplasty. The femoral and acetabular
components appear well-seated.
IMPRESSION: Eight intraoperative fluoroscopic images of the bilateral hips from
bilateral total hip arthroplasty.

## 2022-11-11 ENCOUNTER — Ambulatory Visit: Payer: 59 | Admitting: Family Medicine

## 2022-12-02 ENCOUNTER — Ambulatory Visit: Payer: 59 | Admitting: Orthopaedic Surgery

## 2022-12-16 ENCOUNTER — Ambulatory Visit (INDEPENDENT_AMBULATORY_CARE_PROVIDER_SITE_OTHER): Payer: 59 | Admitting: Family Medicine

## 2022-12-16 VITALS — BP 138/88 | Ht 69.0 in | Wt 215.0 lb

## 2022-12-16 DIAGNOSIS — M654 Radial styloid tenosynovitis [de Quervain]: Secondary | ICD-10-CM

## 2022-12-16 MED ORDER — MELOXICAM 15 MG PO TABS: 15.0000 mg | ORAL_TABLET | Freq: Every day | ORAL | 0 refills | Status: AC

## 2022-12-16 NOTE — Assessment & Plan Note (Signed)
We discussed.  Will try adding meloxicam.  Transitioned him to a thumb spica splint to use all day every day for maximum support.  I really want him to shut the thumb down.  Follow-up 2 weeks.  If not having some improvement, would consider surgery hand referral.

## 2022-12-16 NOTE — Progress Notes (Unsigned)
  Jesus Hammond - 57 y.o. male MRN [id number]  Date of birth: [birth year]    SUBJECTIVE:      Chief Complaint:/ HPI:  Follow-up left thumb pain.  Injection did not seem to help much.  He does admit to continuing to use his hand quite a bit though.  Pain is significantly worsened and at this point he said he is willing to consider anything even surgery.  Pain is constant.  Not using any pain medications.    OBJECTIVE: BP 138/88   Ht 5\' 9"  (1.753 m)   Wt 215 lb (97.5 kg)   BMI 31.75 kg/m   Physical Exam:  Vital signs are reviewed. GENERAL: Well-developed male no acute distress thumb: Left: Generally tender to palpation.  Any range of motion is painful.  He has intact strength in all 3 planes of the thumb.  Distally he is neuro vastly intact.  ASSESSMENT & PLAN:  See problem based charting & AVS for pt instructions. De Quervain's disease (tenosynovitis) We discussed.  Will try adding meloxicam.  Transitioned him to a thumb spica splint to use all day every day for maximum support.  I really want him to shut the thumb down.  Follow-up 2 weeks.  If not having some improvement, would consider surgery hand referral.

## 2023-01-12 ENCOUNTER — Other Ambulatory Visit: Payer: Self-pay | Admitting: Family Medicine

## 2023-01-20 ENCOUNTER — Ambulatory Visit (INDEPENDENT_AMBULATORY_CARE_PROVIDER_SITE_OTHER): Payer: 59 | Admitting: Family Medicine

## 2023-01-20 ENCOUNTER — Encounter: Payer: Self-pay | Admitting: Family Medicine

## 2023-01-20 VITALS — BP 132/88 | Ht 70.0 in | Wt 210.0 lb

## 2023-01-20 DIAGNOSIS — M654 Radial styloid tenosynovitis [de Quervain]: Secondary | ICD-10-CM | POA: Diagnosis not present

## 2023-01-20 NOTE — Assessment & Plan Note (Signed)
He is done really well with the increased immobilization.  I do not want to move too quickly so we will keep him in that during the day.  Start some wrist extension and flexion exercises and some finger exercises.  I want to start slowly.  See him back 2 to 3 weeks.  At that time would consider starting to wean out of thumb spica.  He is amenable to this plan.  Exercises demonstrated and handout given.

## 2023-01-20 NOTE — Progress Notes (Signed)
Jesus Hammond - 57 y.o. male MRN [id number]  Date of birth: 1965/09/15    SUBJECTIVE:      Chief Complaint:/ HPI:  Follow-up left thumb de Quervain's tenosynovitis.  Has been wearing the thumb spica splint all day while he works and then in the evening he wears a lighter splint.  He feels he is about 75% better and quite pleased.  Said about the first week after wearing the thumb spica he started noticing improvement.  He has improved pincer grip using the forefinger.  Still having difficulty apposition thumb and forefinger.    OBJECTIVE: BP 132/88   Ht 5\' 10"  (1.778 m)   Wt 210 lb (95.3 kg)   BMI 30.13 kg/m   Physical Exam:  Vital signs are reviewed. GENERAL: Well-developed no acute distress Wrist: Bilaterally symmetrical.  Full range of motion flexion extension left wrist.  Normal apposition thumb and first finger, he has some pain with apposition of the ring finger.  No muscular atrophy is noted.  Sensation is intact.  ASSESSMENT & PLAN:  See problem based charting & AVS for pt instructions. De Quervain's disease (tenosynovitis) He is done really well with the increased immobilization.  I do not want to move too quickly so we will keep him in that during the day.  Start some wrist extension and flexion exercises and some finger exercises.  I want to start slowly.  See him back 2 to 3 weeks.  At that time would consider starting to wean out of thumb spica.  He is amenable to this plan.  Exercises demonstrated and handout given.

## 2023-02-24 ENCOUNTER — Ambulatory Visit: Payer: 59 | Admitting: Family Medicine
